# Patient Record
Sex: Female | Born: 1965 | Race: White | Hispanic: No | Marital: Married | Smoking: Never smoker
Health system: Southern US, Community
[De-identification: ages and names within clinical notes are randomized; demographics above are authoritative.]

## PROBLEM LIST (undated history)

## (undated) DIAGNOSIS — I519 Heart disease, unspecified: Secondary | ICD-10-CM

## (undated) DIAGNOSIS — R011 Cardiac murmur, unspecified: Secondary | ICD-10-CM

## (undated) DIAGNOSIS — Z9889 Other specified postprocedural states: Secondary | ICD-10-CM

## (undated) DIAGNOSIS — K5790 Diverticulosis of intestine, part unspecified, without perforation or abscess without bleeding: Secondary | ICD-10-CM

## (undated) DIAGNOSIS — Q249 Congenital malformation of heart, unspecified: Secondary | ICD-10-CM

## (undated) DIAGNOSIS — I509 Heart failure, unspecified: Secondary | ICD-10-CM

## (undated) DIAGNOSIS — I38 Endocarditis, valve unspecified: Secondary | ICD-10-CM

## (undated) DIAGNOSIS — E785 Hyperlipidemia, unspecified: Secondary | ICD-10-CM

## (undated) DIAGNOSIS — H10402 Unspecified chronic conjunctivitis, left eye: Secondary | ICD-10-CM

## (undated) DIAGNOSIS — Z9289 Personal history of other medical treatment: Secondary | ICD-10-CM

## (undated) DIAGNOSIS — Z950 Presence of cardiac pacemaker: Secondary | ICD-10-CM

## (undated) DIAGNOSIS — J039 Acute tonsillitis, unspecified: Secondary | ICD-10-CM

## (undated) HISTORY — PX: OTHER SURGICAL HISTORY: SHX169

## (undated) HISTORY — DX: Heart failure, unspecified: I50.9

## (undated) HISTORY — DX: Presence of cardiac pacemaker: Z95.0

## (undated) HISTORY — PX: CARDIAC SURGERY: SHX584

## (undated) HISTORY — DX: Personal history of other medical treatment: Z92.89

## (undated) HISTORY — DX: Other specified postprocedural states: Z98.890

## (undated) HISTORY — DX: Cardiac murmur, unspecified: R01.1

## (undated) HISTORY — DX: Unspecified chronic conjunctivitis, left eye: H10.402

## (undated) HISTORY — DX: Endocarditis, valve unspecified: I38

## (undated) HISTORY — DX: Hyperlipidemia, unspecified: E78.5

## (undated) HISTORY — DX: Heart disease, unspecified: I51.9

## (undated) HISTORY — DX: Acute tonsillitis, unspecified: J03.90

## (undated) HISTORY — DX: Diverticulosis of intestine, part unspecified, without perforation or abscess without bleeding: K57.90

## (undated) HISTORY — PX: TUBAL LIGATION: SHX77

## (undated) HISTORY — DX: Congenital malformation of heart, unspecified: Q24.9

## (undated) HISTORY — PX: ABDOMINAL HYSTERECTOMY: SHX81

---
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
## 1998-04-08 ENCOUNTER — Encounter (HOSPITAL_COMMUNITY): Admission: RE | Admit: 1998-04-08 | Discharge: 1998-07-07 | Payer: Self-pay

## 1998-04-16 ENCOUNTER — Other Ambulatory Visit: Admission: RE | Admit: 1998-04-16 | Discharge: 1998-04-16 | Payer: Self-pay | Admitting: Obstetrics and Gynecology

## 1998-07-09 ENCOUNTER — Encounter (HOSPITAL_COMMUNITY): Admission: RE | Admit: 1998-07-09 | Discharge: 1998-10-01 | Payer: Self-pay

## 1998-10-01 ENCOUNTER — Encounter (HOSPITAL_COMMUNITY): Admission: RE | Admit: 1998-10-01 | Discharge: 1998-12-30 | Payer: Self-pay

## 1999-04-17 ENCOUNTER — Other Ambulatory Visit: Admission: RE | Admit: 1999-04-17 | Discharge: 1999-04-17 | Payer: Self-pay | Admitting: Obstetrics and Gynecology

## 1999-05-26 ENCOUNTER — Encounter: Payer: Self-pay | Admitting: Obstetrics and Gynecology

## 1999-06-11 ENCOUNTER — Inpatient Hospital Stay (HOSPITAL_COMMUNITY): Admission: RE | Admit: 1999-06-11 | Discharge: 1999-06-14 | Payer: Self-pay | Admitting: Obstetrics and Gynecology

## 1999-06-11 ENCOUNTER — Encounter (INDEPENDENT_AMBULATORY_CARE_PROVIDER_SITE_OTHER): Payer: Self-pay | Admitting: Specialist

## 2000-06-07 ENCOUNTER — Other Ambulatory Visit: Admission: RE | Admit: 2000-06-07 | Discharge: 2000-06-07 | Payer: Self-pay | Admitting: Obstetrics and Gynecology

## 2001-07-27 ENCOUNTER — Other Ambulatory Visit: Admission: RE | Admit: 2001-07-27 | Discharge: 2001-07-27 | Payer: Self-pay | Admitting: Obstetrics and Gynecology

## 2002-08-16 ENCOUNTER — Other Ambulatory Visit: Admission: RE | Admit: 2002-08-16 | Discharge: 2002-08-16 | Payer: Self-pay | Admitting: Obstetrics and Gynecology

## 2003-09-09 ENCOUNTER — Other Ambulatory Visit: Admission: RE | Admit: 2003-09-09 | Discharge: 2003-09-09 | Payer: Self-pay | Admitting: Obstetrics and Gynecology

## 2004-10-06 ENCOUNTER — Other Ambulatory Visit: Admission: RE | Admit: 2004-10-06 | Discharge: 2004-10-06 | Payer: Self-pay | Admitting: Obstetrics and Gynecology

## 2005-11-09 ENCOUNTER — Other Ambulatory Visit: Admission: RE | Admit: 2005-11-09 | Discharge: 2005-11-09 | Payer: Self-pay | Admitting: Obstetrics and Gynecology

## 2005-11-24 ENCOUNTER — Encounter: Admission: RE | Admit: 2005-11-24 | Discharge: 2006-02-22 | Payer: Self-pay | Admitting: Obstetrics and Gynecology

## 2009-12-10 ENCOUNTER — Encounter (INDEPENDENT_AMBULATORY_CARE_PROVIDER_SITE_OTHER): Payer: Self-pay | Admitting: *Deleted

## 2009-12-17 ENCOUNTER — Ambulatory Visit: Payer: Self-pay | Admitting: Gastroenterology

## 2009-12-17 DIAGNOSIS — K5900 Constipation, unspecified: Secondary | ICD-10-CM | POA: Insufficient documentation

## 2009-12-17 DIAGNOSIS — R195 Other fecal abnormalities: Secondary | ICD-10-CM

## 2010-01-21 ENCOUNTER — Ambulatory Visit: Payer: Self-pay | Admitting: Gastroenterology

## 2010-01-21 LAB — HM COLONOSCOPY

## 2010-10-04 HISTORY — PX: APPENDECTOMY: SHX54

## 2010-10-10 ENCOUNTER — Inpatient Hospital Stay (HOSPITAL_COMMUNITY): Admission: RE | Admit: 2010-10-10 | Discharge: 2010-10-14 | Payer: Self-pay

## 2010-10-10 ENCOUNTER — Emergency Department (HOSPITAL_COMMUNITY)
Admission: EM | Admit: 2010-10-10 | Discharge: 2010-10-10 | Payer: Self-pay | Source: Home / Self Care | Admitting: Emergency Medicine

## 2010-10-11 ENCOUNTER — Encounter (INDEPENDENT_AMBULATORY_CARE_PROVIDER_SITE_OTHER): Payer: Self-pay

## 2010-10-19 LAB — DIFFERENTIAL
Basophils Absolute: 0 10*3/uL (ref 0.0–0.1)
Basophils Relative: 0 % (ref 0–1)
Eosinophils Absolute: 0 10*3/uL (ref 0.0–0.7)
Eosinophils Relative: 0 % (ref 0–5)
Lymphocytes Relative: 4 % — ABNORMAL LOW (ref 12–46)
Lymphs Abs: 0.7 10*3/uL (ref 0.7–4.0)
Monocytes Absolute: 1.2 10*3/uL — ABNORMAL HIGH (ref 0.1–1.0)
Monocytes Relative: 7 % (ref 3–12)
Neutro Abs: 15.4 10*3/uL — ABNORMAL HIGH (ref 1.7–7.7)
Neutrophils Relative %: 89 % — ABNORMAL HIGH (ref 43–77)

## 2010-10-19 LAB — BASIC METABOLIC PANEL
BUN: 9 mg/dL (ref 6–23)
BUN: 7 mg/dL (ref 6–23)
BUN: 4 mg/dL — ABNORMAL LOW (ref 6–23)
CO2: 25 mEq/L (ref 19–32)
CO2: 27 mEq/L (ref 19–32)
CO2: 28 mEq/L (ref 19–32)
Calcium: 9.3 mg/dL (ref 8.4–10.5)
Calcium: 8.3 mg/dL — ABNORMAL LOW (ref 8.4–10.5)
Calcium: 7.8 mg/dL — ABNORMAL LOW (ref 8.4–10.5)
Chloride: 100 mEq/L (ref 96–112)
Chloride: 103 mEq/L (ref 96–112)
Chloride: 102 mEq/L (ref 96–112)
Creatinine, Ser: 0.73 mg/dL (ref 0.4–1.2)
Creatinine, Ser: 0.55 mg/dL (ref 0.4–1.2)
Creatinine, Ser: 0.55 mg/dL (ref 0.4–1.2)
GFR calc Af Amer: >60 mL/min (ref >60)
GFR calc Af Amer: >60 mL/min (ref >60)
GFR calc Af Amer: >60 mL/min (ref >60)
GFR calc non Af Amer: >60 mL/min (ref >60)
GFR calc non Af Amer: >60 mL/min (ref >60)
GFR calc non Af Amer: >60 mL/min (ref >60)
Glucose, Bld: 124 mg/dL — ABNORMAL HIGH (ref 70–99)
Glucose, Bld: 145 mg/dL — ABNORMAL HIGH (ref 70–99)
Glucose, Bld: 116 mg/dL — ABNORMAL HIGH (ref 70–99)
Potassium: 3.9 mEq/L (ref 3.5–5.1)
Potassium: 3.6 mEq/L (ref 3.5–5.1)
Potassium: 2.8 mEq/L — ABNORMAL LOW (ref 3.5–5.1)
Sodium: 135 mEq/L (ref 135–145)
Sodium: 139 mEq/L (ref 135–145)
Sodium: 137 mEq/L (ref 135–145)

## 2010-10-19 LAB — URINALYSIS, ROUTINE W REFLEX MICROSCOPIC
Bilirubin Urine: NEGATIVE
Ketones, ur: NEGATIVE mg/dL
Leukocytes, UA: NEGATIVE
Nitrite: NEGATIVE
Protein, ur: 30 mg/dL — AB
Specific Gravity, Urine: 1.015 (ref 1.005–1.030)
Urine Glucose, Fasting: NEGATIVE mg/dL
Urobilinogen, UA: 0.2 mg/dL (ref 0.0–1.0)
pH: 7 (ref 5.0–8.0)

## 2010-10-19 LAB — PREPARE FRESH FROZEN PLASMA
Unit division: 0
Unit division: 0
Unit division: 0
Unit division: 0

## 2010-10-19 LAB — URINE MICROSCOPIC-ADD ON

## 2010-10-19 LAB — CBC
HCT: 40 % (ref 36.0–46.0)
HCT: 36.8 % (ref 36.0–46.0)
HCT: 33.1 % — ABNORMAL LOW (ref 36.0–46.0)
HCT: 33.1 % — ABNORMAL LOW (ref 36.0–46.0)
Hemoglobin: 14.6 g/dL (ref 12.0–15.0)
Hemoglobin: 12.5 g/dL (ref 12.0–15.0)
Hemoglobin: 11.3 g/dL — ABNORMAL LOW (ref 12.0–15.0)
Hemoglobin: 11.2 g/dL — ABNORMAL LOW (ref 12.0–15.0)
MCH: 32.7 pg (ref 26.0–34.0)
MCH: 32 pg (ref 26.0–34.0)
MCH: 31.6 pg (ref 26.0–34.0)
MCH: 31.4 pg (ref 26.0–34.0)
MCHC: 36.5 g/dL — ABNORMAL HIGH (ref 30.0–36.0)
MCHC: 34 g/dL (ref 30.0–36.0)
MCHC: 34.1 g/dL (ref 30.0–36.0)
MCHC: 33.8 g/dL (ref 30.0–36.0)
MCV: 89.5 fL (ref 78.0–100.0)
MCV: 94.1 fL (ref 78.0–100.0)
MCV: 92.5 fL (ref 78.0–100.0)
MCV: 92.7 fL (ref 78.0–100.0)
Platelets: 132 10*3/uL — ABNORMAL LOW (ref 150–400)
Platelets: 94 10*3/uL — ABNORMAL LOW (ref 150–400)
Platelets: 114 10*3/uL — ABNORMAL LOW (ref 150–400)
Platelets: 111 10*3/uL — ABNORMAL LOW (ref 150–400)
RBC: 4.47 MIL/uL (ref 3.87–5.11)
RBC: 3.91 MIL/uL (ref 3.87–5.11)
RBC: 3.58 MIL/uL — ABNORMAL LOW (ref 3.87–5.11)
RBC: 3.57 MIL/uL — ABNORMAL LOW (ref 3.87–5.11)
RDW: 13 % (ref 11.5–15.5)
RDW: 13.3 % (ref 11.5–15.5)
RDW: 12.9 % (ref 11.5–15.5)
RDW: 12.8 % (ref 11.5–15.5)
WBC: 17.4 10*3/uL — ABNORMAL HIGH (ref 4.0–10.5)
WBC: 12.7 10*3/uL — ABNORMAL HIGH (ref 4.0–10.5)
WBC: 8.8 10*3/uL (ref 4.0–10.5)
WBC: 6.9 10*3/uL (ref 4.0–10.5)

## 2010-10-19 LAB — COMPREHENSIVE METABOLIC PANEL
ALT: 22 U/L (ref 0–35)
AST: 24 U/L (ref 0–37)
Albumin: 4.1 g/dL (ref 3.5–5.2)
Alkaline Phosphatase: 41 U/L (ref 39–117)
BUN: 8 mg/dL (ref 6–23)
CO2: 24 mEq/L (ref 19–32)
Calcium: 9.2 mg/dL (ref 8.4–10.5)
Chloride: 100 mEq/L (ref 96–112)
Creatinine, Ser: 0.71 mg/dL (ref 0.4–1.2)
GFR calc Af Amer: >60 mL/min (ref >60)
GFR calc non Af Amer: >60 mL/min (ref >60)
Glucose, Bld: 124 mg/dL — ABNORMAL HIGH (ref 70–99)
Potassium: 3.8 mEq/L (ref 3.5–5.1)
Sodium: 135 mEq/L (ref 135–145)
Total Bilirubin: 1 mg/dL (ref 0.3–1.2)
Total Protein: 7.5 g/dL (ref 6.0–8.3)

## 2010-10-19 LAB — ABO/RH
ABO/RH(D): O POS
ABO/RH(D): O POS

## 2010-10-19 LAB — PROTIME-INR
INR: 6.87 (ref 0.00–1.49)
INR: 1.3 (ref 0.00–1.49)
INR: 1.31 (ref 0.00–1.49)
INR: 1.34 (ref 0.00–1.49)
Prothrombin Time: 59 seconds — ABNORMAL HIGH (ref 11.6–15.2)
Prothrombin Time: 16.4 seconds — ABNORMAL HIGH (ref 11.6–15.2)
Prothrombin Time: 16.5 seconds — ABNORMAL HIGH (ref 11.6–15.2)
Prothrombin Time: 16.8 seconds — ABNORMAL HIGH (ref 11.6–15.2)

## 2010-10-19 LAB — TYPE AND SCREEN
ABO/RH(D): O POS
Antibody Screen: NEGATIVE

## 2010-10-19 LAB — MRSA PCR SCREENING: MRSA by PCR: NEGATIVE

## 2010-10-19 LAB — LIPASE, BLOOD: Lipase: 20 U/L (ref 11–59)

## 2010-10-19 LAB — APTT
aPTT: 127 seconds — ABNORMAL HIGH (ref 24–37)
aPTT: 37 seconds (ref 24–37)

## 2010-10-19 LAB — MAGNESIUM: Magnesium: 1.8 mg/dL (ref 1.5–2.5)

## 2010-11-03 NOTE — Procedures (Signed)
Summary: Hold letter/[facility]  Hold letter/[facility]   Imported By: Lester Pomona 12/24/2009 09:52:39  _____________________________________________________________________  External Attachment:    Type:   Image     Comment:   External Document

## 2010-11-03 NOTE — Letter (Signed)
Summary: New Patient letter  [facility]  [address] [place], [place] [address]   Phone: [phone]  Fax: [phone]       12/10/2009 MRN: [id number]  Brandi Fischer [address] [place], [place]  [address]  Dear Ms. Andrey Campanile,  Welcome to the Gastroenterology Division at [facility].    You are scheduled to see Dr. Claudette Head on December 17, 2009 at 2:30pm on the 3rd floor at [facility], [address]  We ask that you try to arrive at our office 15 minutes prior to your appointment time to allow for check-in.  We would like you to complete the enclosed self-administered evaluation form prior to your visit and bring it with you on the day of your appointment.  We will review it with you.  Also, please bring a complete list of all your medications or, if you prefer, bring the medication bottles and we will list them.  Please bring your insurance card so that we may make a copy of it.  If your insurance requires a referral to see a specialist, please bring your referral form from your primary care physician.  Co-payments are due at the time of your visit and may be paid by cash, check or credit card.     Your office visit will consist of a consult with your physician (includes a physical exam), any laboratory testing he/she may order, scheduling of any necessary diagnostic testing (e.g. x-ray, ultrasound, CT-scan), and scheduling of a procedure (e.g. Endoscopy, Colonoscopy) if required.  Please allow enough time on your schedule to allow for any/all of these possibilities.    If you cannot keep your appointment, please call [phone] to cancel or reschedule prior to your appointment date.  This allows [place] the opportunity to schedule an appointment for another patient in need of care.  If you do not cancel or reschedule by 5 p.m. the business day prior to your appointment date, you will be charged a $50.00 late cancellation/no-show fee.    Thank you for  choosing [facility] for your medical needs.  We appreciate the opportunity to care for you.  Please visit [place] at our website  to learn more about our practice.                     Sincerely,                                                             The Gastroenterology Division

## 2010-11-03 NOTE — Assessment & Plan Note (Signed)
Summary: +HEM STOOL.Marland KitchenMarland KitchenEM   History of Present Illness Visit Type: consult Primary GI MD: Elie Goody MD [facility] Primary Provider: Kari Baars, MD Requesting Provider: Ainsley Spinner Chief Complaint: positive hemoccult test at GYN office History of Present Illness:   This is a 45 year old female with Hemoccult-positive stool and mild chronic constipation. 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 She has mild chronic constipation and occasionally notes rectal discomfort with hard bowel movements.  She has a history of heart block with a permanent pacemaker.   GI Review of Systems      Denies abdominal pain, acid reflux, belching, bloating, chest pain, dysphagia with liquids, dysphagia with solids, heartburn, loss of appetite, nausea, vomiting, vomiting blood, weight loss, and  weight gain.      Reports constipation and  heme positive stool.     Denies anal fissure, black tarry stools, change in bowel habit, diarrhea, diverticulosis, fecal incontinence, hemorrhoids, irritable bowel syndrome, jaundice, light color stool, liver problems, rectal bleeding, and  rectal pain.   Current Medications (verified): 1)  Lanoxin 0.125 Mg Tabs (Digoxin) .... One Tablet By Mouth Once Daily 2)  Atenolol 25 Mg Tabs (Atenolol) .... One Tablet By Mouth Once Daily 3)  Coumadin 5 Mg Tabs (Warfarin Sodium) .... One Tablet By Mouth At Bedtime For 2 Days, On 3rd Day Take 2 Tablets 4)  Centrum Ultra Womens  Tabs (Multiple Vitamins-Minerals) .... Take 1 Tablet By Mouth Once A Day 5)  Calcium 600 1500 Mg Tabs (Calcium Carbonate) .... Take 1 Tablet By Mouth Two Times A Day  Allergies (verified): 1)  ! 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  Alcohol Use - no Illicit Drug Use - no  Review of Systems       The pertinent positives and negatives are noted as above and in the HPI. All other ROS were reviewed and were negative.   Vital Signs:  Patient profile:   45 year old female Height:      62 inches Weight:      140 pounds BMI:     25.70 Pulse rate:   60 / minute Pulse rhythm:   regular BP sitting:   100 / 60  (left arm) Cuff size:   regular  Vitals Entered By: Francee Piccolo CMA Duncan Dull) (December 17, 2009 2:24 PM)  Physical Exam  General:  Well developed, well nourished, no acute distress. Head:  Normocephalic and atraumatic. Eyes:  PERRLA, no icterus. Ears:  Normal auditory acuity. Mouth:  No deformity or lesions, dentition normal. Neck:  Supple; no masses or thyromegaly. Chest Wall:  Symmetrical;  no deformities or tenderness. Heart:  Regular rate and rhythm; no murmurs, rubs,  or bruits. Abdomen:  Soft, nontender and nondistended. No masses, hepatosplenomegaly or hernias noted. Normal bowel sounds. Rectal:  deferred until time of colonoscopy.   Msk:  Symmetrical with no gross deformities. Normal posture. Pulses:  Normal pulses noted. Extremities:  No clubbing, cyanosis, edema or deformities noted. Neurologic:  Alert and  oriented x4;  grossly normal neurologically. Cervical Nodes:  No significant cervical adenopathy. Inguinal Nodes:  No significant inguinal adenopathy. Psych:  Alert and cooperative. Normal mood and affect.  Impression & Recommendations:  Problem # 1:  BLOOD IN STOOL, OCCULT (ICD-792.1) Rule out colorectal neoplasms, hemorrhoids and other colorectal sources of blood loss. The risks, benefits and alternatives to colonoscopy with possible biopsy and possible  polypectomy were discussed with the patient and they consent to proceed. The procedure will be scheduled electively.  Orders: Colonoscopy (Colon)  Problem # 2:  COUMADIN THERAPY (ICD-V58.61) The risks, benefits, and alternatives to 5 day hold Coumadin anticoagulation were discussed with the patient and she consents to proceed. Obtain clearance from Dr. Juanetta Gosling  Problem # 3:  CONSTIPATION (ICD-564.00) Increase daily fiber and fluid intake.  Patient Instructions: 1)  Colonoscopy brochure given.  2)  Pick up your prep from your pharmacy.  3)  Copy sent to : Kari Baars, MD 4)                         Richardean Chimera, MD 5)  The medication list was reviewed and reconciled.  All changed / newly prescribed medications were explained.  A complete medication list was provided to the patient / caregiver.  Prescriptions: MOVIPREP 100 GM  SOLR (PEG-KCL-NACL-NASULF-NA ASC-C) As per prep instructions.  #1 x 0   Entered by:   Christie Nottingham CMA (AAMA)   Authorized by:   Meryl Dare MD [facility]   Signed by:   Christie Nottingham CMA (AAMA) on 12/17/2009   Method used:   Electronically to        [facility] (retail)       [address]       [place], [place]  [address]       Ph: [phone] or [phone]       Fax: [phone]   RxID:   [id number]

## 2010-11-03 NOTE — Procedures (Signed)
Summary: Colonoscopy  Patient: Brandi Fischer Note: All result statuses are Final unless otherwise noted.  Tests: (1) Colonoscopy (COL)   COL Colonoscopy           DONE      [facility]     [address]     [place], [place]  [address]           COLONOSCOPY PROCEDURE REPORT           PATIENT:  Brandi Fischer, Brandi Fischer  MR#:  [id number]     BIRTHDATE:  16-Jun-1966, 44 yrs. old  GENDER:  female     ENDOSCOPIST:  Judie Petit T. Russella Dar, MD, [facility]     Referred by:  Richardean Chimera, M.D.     PROCEDURE DATE:  01/21/2010     PROCEDURE:  Colonoscopy 21308     ASA CLASS:  Class III     INDICATIONS:  1) FOBT positive stool     MEDICATIONS:   Fentanyl 75 mcg IV, Versed 7 mg IV     DESCRIPTION OF PROCEDURE:   After the risks benefits and     alternatives of the procedure were thoroughly explained, informed     consent was obtained.  Digital rectal exam was performed and     revealed no abnormalities.   The LB [id number]-[id number] endoscope     was introduced through the anus and advanced to the cecum, which     was identified by both the appendix and ileocecal valve, without     limitations.  The quality of the prep was excellent, using     MoviPrep.  The instrument was then slowly withdrawn as the colon     was fully examined.     <<PROCEDUREIMAGES>>           FINDINGS:  Mild diverticulosis was found in the sigmoid colon. A     normal appearing cecum, ileocecal valve, and appendiceal orifice     were identified. The ascending, hepatic flexure, transverse,     splenic flexure, descending, and rectum appeared unremarkable.     Retroflexed views in the rectum revealed no abnormalities. The     time to cecum =  2  minutes. The scope was then withdrawn (time =     9.25  min) from the patient and the procedure completed.           COMPLICATIONS:  None           ENDOSCOPIC IMPRESSION:     1) Mild diverticulosis in the sigmoid colon           RECOMMENDATIONS:     1) High fiber diet with liberal fluid  intake.     2) Continue current colorectal screening recommendations for     "routine risk" patients with a repeat colonoscopy in 10 years.     3) Resume Lovenox and Coumadin as directed today           Perrin Gens T. Russella Dar, MD, [facility]           CC: Shaune Pollack, MD           n.     Rosalie DoctorVenita Lick. Kendelle Schweers at 01/21/2010 04:17 PM           [employer] Dials, [id number]  Note: An exclamation mark (!) indicates a result that was not dispersed into the flowsheet. Document Creation Date: 01/21/2010 4:18 PM _______________________________________________________________________  (1) Order result status: Final Collection or observation date-time: 01/21/2010 16:06 Requested date-time:  Receipt date-time:  Reported date-time:  Referring Physician:   Ordering Physician: Claudette Head [phone]) Specimen Source:  Source: Launa Grill Order Number: [id number] Lab site:   Appended Document: Colonoscopy    Clinical Lists Changes  Observations: Added new observation of COLONNXTDUE: 01/2020 (01/21/2010 16:20)

## 2010-11-03 NOTE — Letter (Signed)
Summary: Anticoagulation Modification Letter  [facility]  [address] [place], [place] [address]   Phone: [phone]  Fax: [phone]    December 17, 2009  Re:    Brandi Fischer    Dear Dr. Juanetta Gosling,  We have scheduled the above patient for an endoscopic procedure. Our records show that  he/she is on anticoagulation therapy. Please advise as to how long the patient may come off their therapy of coumadin prior to the scheduled procedure(s) on 01/21/10.   Please fax back/or route the completed form to [place] at [phone].  Thank you for your help with this matter.  Sincerely,  Christie Nottingham CMA Duncan Dull)   Physician Recommendation:  Hold Plavix 7 days prior ________________  Hold Coumadin 5 days prior ____________  Other ______________________________     Appended Document: Anticoagulation Modification Letter Spoke with Joy at Dr. Juanetta Gosling office and she states the pt has to be bridged with Lovenox. She states she will set the pt up for this and notify her of what to do before the procedure. Joy states she will contact me if she needs anything else or has questions. She agreed to take care of everything and notifiy the pt. Letter to be scanned into EMR showing Dr. Juanetta Gosling response to pt coming off coumadin.

## 2010-11-03 NOTE — Letter (Signed)
Summary: [facility] Instructions  [facility]  [address] [place], [place] [address]   Phone: [phone]  Fax: [phone]       Brandi Fischer    1966-07-28    MRN: [id number]        Procedure Day Dorna Bloom: Wednesday April 20th, 2011     Arrival Time: 2:00pm     Procedure Time: 3:00pm     Location of Procedure:                    _ x_  [facility] (4th Floor)                        PREPARATION FOR COLONOSCOPY WITH MOVIPREP   Starting 5 days prior to your procedure  01/16/10 do not eat nuts, seeds, popcorn, corn, beans, peas,  salads, or any raw vegetables.  Do not take any fiber supplements (e.g. Metamucil, Citrucel, and Benefiber).  THE DAY BEFORE YOUR PROCEDURE         DATE: 01/20/10   DAY:  Tuesday  1.  Drink clear liquids the entire day-NO SOLID FOOD  2.  Do not drink anything colored red or purple.  Avoid juices with pulp.  No orange juice.  3.  Drink at least 64 oz. (8 glasses) of fluid/clear liquids during the day to prevent dehydration and help the prep work efficiently.  CLEAR LIQUIDS INCLUDE: Water Jello Ice Popsicles Tea (sugar ok, no milk/cream) Powdered fruit flavored drinks Coffee (sugar ok, no milk/cream) Gatorade Juice: apple, white grape, white cranberry  Lemonade Clear bullion, consomm, broth Carbonated beverages (any kind) Strained chicken noodle soup Hard Candy                             4.  In the morning, mix first dose of MoviPrep solution:    Empty 1 Pouch A and 1 Pouch B into the disposable container    Add lukewarm drinking water to the top line of the container. Mix to dissolve    Refrigerate (mixed solution should be used within 24 hrs)  5.  Begin drinking the prep at 5:00 p.m. The MoviPrep container is divided by 4 marks.   Every 15 minutes drink the solution down to the next mark (approximately 8 oz) until the full liter is complete.   6.  Follow completed prep with 16 oz of clear liquid of your  choice (Nothing red or purple).  Continue to drink clear liquids until bedtime.  7.  Before going to bed, mix second dose of MoviPrep solution:    Empty 1 Pouch A and 1 Pouch B into the disposable container    Add lukewarm drinking water to the top line of the container. Mix to dissolve    Refrigerate  THE DAY OF YOUR PROCEDURE      DATE:  01/21/10  DAY:  Wednesday  Beginning at  10:00 a.m. (5 hours before procedure):         1. Every 15 minutes, drink the solution down to the next mark (approx 8 oz) until the full liter is complete.  2. Follow completed prep with 16 oz. of clear liquid of your choice.    3. You may drink clear liquids until  1:00pm  (2 HOURS BEFORE PROCEDURE).   MEDICATION INSTRUCTIONS  Unless otherwise instructed, you should take regular prescription medications with a small sip of water  as early as possible the morning of your procedure.     Stop taking Coumadin on  _ _  (5 days before procedure) You will be contaced by our office prior to your procedure for directions on holding your Coumadin/Warfarin.  If you do not hear from our office 1 week prior to your scheduled procedure, please call [phone] to discuss.          OTHER INSTRUCTIONS  You will need a responsible adult at least 45 years of age to accompany you and drive you home.   This person must remain in the waiting room during your procedure.  Wear loose fitting clothing that is easily removed.  Leave jewelry and other valuables at home.  However, you may wish to bring a book to read or  an iPod/MP3 player to listen to music as you wait for your procedure to start.  Remove all body piercing jewelry and leave at home.  Total time from sign-in until discharge is approximately 2-3 hours.  You should go home directly after your procedure and rest.  You can resume normal activities the  day after your procedure.  The day of your procedure you should not:   Drive   Make legal  decisions   Operate machinery   Drink alcohol   Return to work  You will receive specific instructions about eating, activities and medications before you leave.    The above instructions have been reviewed and explained to me by   Marchelle Folks.     I fully understand and can verbalize these instructions _____________________________ Date _________

## 2010-11-18 NOTE — Consult Note (Signed)
NAMEROAN, [name]               ACCOUNT NO.:  [id number]  MEDICAL RECORD NO.:  [id number]          PATIENT TYPE:  INP  LOCATION:  3305                         FACILITY:  [facility]  PHYSICIAN:  Karn Cassis, MD  DATE OF BIRTH:  06/28/1966  DATE OF CONSULTATION:  10/10/2010 DATE OF DISCHARGE:                                CONSULTATION   CARDIOLOGIST:  Dr. Sharol Harness and Dr. Basilia Jumbo at [facility].  REASON FOR CONSULTATION:  We are seeing [name] in consultation with Dr. Janee Morn for preoperative evaluation prior to an emergent appendectomy.  HISTORY OF PRESENT ILLNESS:  Brandi Fischer is a 45 year old lady with history of tetralogy of Fallot, status post surgical repair in 1969 who is admitted to the hospital with appendicitis.  The patient reports that her cardiac history is significant for tetralogy of Fallot repair.  After her surgical repair, she has not had any problems and is able to live a very functional and active lifestyle. Her MEP are greater than 4.  She had actually no symptoms of angina or heart failure with physical exertion.  She also underwent a pacemaker placement in 1990s.  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  Since then, she has been on anticoagulation with warfarin.  She was in her usual state of health up until Friday when she developed right lower quadrant abdominal pain accompanied with symptoms of nausea. Her workup revealed elevated white count of 17.4 and she was diagnosed with having acute appendicitis.  She is currently waiting surgery pending correction of her INR which was 6.8.  PAST MEDICAL HISTORY: 1. Tetralogy of Fallot, status post repair. 2. Pacemaker ([employer]. [employer]). 3. 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  ALLERGIES:  No known drug allergies.  MEDICATIONS:  She is on: 1. Atenolol 25 mg once a day. 2.  Warfarin.  She alternates between 5 and 10 mg every other day. 3. Digoxin 0.125 mg once a day.  FAMILY HISTORY:  No significant family history of congenital heart disease.  She has no family history of early coronary artery disease.  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  No history of alcohol use, illicit drug use, or smoking.  REVIEW OF SYSTEMS:  A 12-point review of systems is negative except for symptoms outlined in the HPI above.  PHYSICAL EXAMINATION:  VITAL SIGNS:  Her temperature is 100.2, pulse of 100, respiratory rate 16, and blood pressure 108/51.  She is saturating 94% on room air. GENERAL:  A well-appearing lady, in no apparent distress. CARDIOVASCULAR:  Regular rate and rhythm.  She has 5/6 systolic ejection murmur that is best heard at the left upper sternal border. LUNGS:  Clear to auscultation bilaterally. ABDOMEN.  She has diffuse abdominal tenderness with diminished bowel sounds. EXTREMITIES:  No signs of clubbing, cyanosis, or edema. PSYCH:  Alert and oriented to time, place, and person. SKIN:  No rashes or lesions.  LABORATORY DATA:  As mentioned above, her white count is 17.4, hematocrit is 40, and  platelet count of 132.  Her INR is 6.87.  Sodium of 135, potassium 3.8, chloride 100, bicarb 24, and creatinine 0.7.  Her AST and ALT are within normal limits.  EKG shows an incomplete right bundle, occasional atrial-paced rhythm, rare PVC, and ST changes consistent with incomplete right bundle.  ASSESSMENT/PLAN:  In summary, this is a 45 year old lady with history of repaired at tetralogy of Fallot who is admitted for appendicitis.  She is scheduled to go an emergent appendectomy.  We are seeing her in consultation for preoperative evaluation.  1. Proceed to surgery.  She will need antibiotic prophylaxis since she     has congenital heart status post repair.  I recommend ampicillin 2     g IV or cefazolin 1 g IV or ceftriaxone  1 g IV.  Her EKG did not     show any signs of ischemia.  It is common for the patients with     tetralogy repair to have a right bundle.  If the surgery is     performed in the morning, the pacemaker could be programed to [facility]     since she is being only intermittently paced at the atrium without     ventricular pacing.  If the surgery is performed in the middle of     night, a magnet could be used instead.  In addition to above, I     recommend continuation of her home dose of beta-blocker.  The above plan was discussed with Dr. Jens Som.     Karn Cassis, MD     PV/MEDQ  D:  10/10/2010  T:  10/11/2010  Job:  [id number]  Electronically Signed by Karn Cassis MD on 11/18/2010 01:22:17 PM

## 2011-07-01 ENCOUNTER — Encounter: Payer: Self-pay | Admitting: *Deleted

## 2011-07-01 ENCOUNTER — Encounter: Payer: BC Managed Care – PPO | Attending: Obstetrics and Gynecology | Admitting: *Deleted

## 2011-07-01 DIAGNOSIS — E789 Disorder of lipoprotein metabolism, unspecified: Secondary | ICD-10-CM | POA: Insufficient documentation

## 2011-07-01 DIAGNOSIS — Z713 Dietary counseling and surveillance: Secondary | ICD-10-CM | POA: Insufficient documentation

## 2011-07-01 NOTE — Patient Instructions (Addendum)
Goals:   Follow meal pattern on yellow card; Aim for 1200 calories/day if not exercising.  Increase fiber to 25-30 g and fluid to >64 oz daily to help decrease cholesterol and regulate blood sugar.  Limit meals away from home to decrease sodium and saturated fat intake.  Keep intake of dietary cholesterol to <200-300 mg daily.  Keep a food journal for a minimum of 2-3 days/week for the next 30 days and bring to next appointment.  Aim for 30 min of moderate physical exercise 3 times/week. Try walking around the neighborhood; break into increments if needed.

## 2011-07-01 NOTE — Progress Notes (Signed)
Medical Nutrition Therapy:  Appt start time: 1630 end time:  1730.  Assessment:  Primary concerns today: Abnormal Lipid Metabolism (272.9).  Pt here with elevated lipid panel.  Reports open heart surgery as an infant and a pacemaker since 6th grade. Also states her MD thinks she is on the brink of metabolic syndrome as pt has elevated FBGs as well. Food recall reveals elevated Na+ intake through meals out and some excessive simple sugar intake.  Pt loves bread and starches, not much of sweets person.  No ETOH noted.     MEDICATIONS: Atenolol, Coumadin, MVI, Vit D3 (50K unit/wk - on 4th of 6 wk treatment), Lanoxin   DIETARY INTAKE:  Usual eating pattern includes 3 meals and 2 snacks per day.  Pt limits Vit K intake d/t coumadin.  24-hr recall:  B ( AM): Instant oatmeal w/ maple, brown sugar; 1 cup coffee (low caffeine), 6 oz OJ (takes meds)  Snk ( AM): fiber one bar  L ( PM): lean cuisine (cheese ravioli), lg apple; water OR pecan crusted trout w/ zuccini/squash and grits; 1/2&1/2 tea (1-2x/wk out) Snk ( PM): none D ( PM): Grilled pork chops, brown rice (1/2 cup), baked beans; sweet tea (12 oz) OR [place] quesadilla w/ chicken & rice; 1/2&1/2 tea Snk ( PM): mini bag popcorn; diet pepsi Beverages: sweet tea, water, diet soda (caffeine free)  Usual physical activity: None  Estimated energy needs: 1200 calories 150 g carbohydrates 75 g protein 30-35 g fat (<10g as saturated fat)  Nutritional Diagnosis:  Taylor-2.2 Altered nutrition-related laboratory related to excessive intake of simple sugars and fat through meals from outside the home as evidenced by elevated TG/cholesterol and MD referral for abnormal lipid metabolism..    Intervention/Goals:    Follow meal pattern on yellow card; Aim for 1200 calories/day if not exercising.  Increase fiber to 25-30 g daily to help decrease cholesterol and regulate blood sugar.  Limit meals away from home to decrease sodium and saturated fat  intake.  Keep intake of dietary cholesterol to <200-300 mg daily.  Keep a food journal for a minimum of 2-3 days/week for the next 30 days and bring to next appointment.  Aim for 30 min of moderate physical exercise 3 times/week. Try walking around the neighborhood; break into increments if needed.  Handouts given during visit include:  Cholesterol handouts  HTN/Chol class ppt  One Day Food Record  Monitoring/Evaluation:  Dietary intake, exercise, lipid panel (as available), and body weight in 4 week(s).

## 2011-07-14 LAB — PROTIME-INR: INR: 2.5 — AB (ref 0.9–1.1)

## 2011-08-18 LAB — PROTIME-INR: INR: 3.3 — AB (ref 0.9–1.1)

## 2011-10-13 LAB — PROTIME-INR: INR: 2.8 — AB (ref 0.9–1.1)

## 2011-11-17 LAB — PROTIME-INR: INR: 3.1 — AB (ref 0.9–1.1)

## 2011-11-18 ENCOUNTER — Other Ambulatory Visit (HOSPITAL_COMMUNITY): Payer: Self-pay | Admitting: Pulmonary Disease

## 2011-11-18 DIAGNOSIS — R1901 Right upper quadrant abdominal swelling, mass and lump: Secondary | ICD-10-CM

## 2011-11-23 ENCOUNTER — Ambulatory Visit (HOSPITAL_COMMUNITY)
Admission: RE | Admit: 2011-11-23 | Discharge: 2011-11-23 | Disposition: A | Discharge status: Home / Self Care | Payer: BC Managed Care – PPO | Source: Ambulatory Visit | Attending: Pulmonary Disease | Admitting: Pulmonary Disease

## 2011-11-23 DIAGNOSIS — R1901 Right upper quadrant abdominal swelling, mass and lump: Secondary | ICD-10-CM | POA: Insufficient documentation

## 2011-11-23 MED ORDER — IOHEXOL 300 MG/ML  SOLN
100.0000 mL | Freq: Once | INTRAMUSCULAR | Status: AC | PRN
  Administered 2011-11-23: 100 mL via INTRAVENOUS

## 2012-01-17 DIAGNOSIS — Q203 Discordant ventriculoarterial connection: Secondary | ICD-10-CM | POA: Insufficient documentation

## 2012-01-19 LAB — PROTIME-INR: INR: 3.1 — AB (ref 0.9–1.1)

## 2012-03-22 LAB — PROTIME-INR: INR: 1.9 — AB (ref 0.9–1.1)

## 2012-04-25 LAB — PROTIME-INR: INR: 3.3 — AB (ref 0.9–1.1)

## 2012-05-30 LAB — PROTIME-INR: INR: 2.3 — AB (ref 0.9–1.1)

## 2012-07-26 LAB — PROTIME-INR: INR: 2.3 — AB (ref 0.9–1.1)

## 2012-11-06 LAB — PROTIME-INR: INR: 2.1 — AB (ref 0.9–1.1)

## 2012-12-28 LAB — PROTIME-INR: INR: 1.9 — AB (ref 0.9–1.1)

## 2013-01-15 DIAGNOSIS — Z950 Presence of cardiac pacemaker: Secondary | ICD-10-CM | POA: Insufficient documentation

## 2013-01-24 LAB — PROTIME-INR: INR: 3.1 — AB (ref 0.9–1.1)

## 2013-03-13 LAB — PROTIME-INR: INR: 1.8 — AB (ref 0.9–1.1)

## 2013-06-19 LAB — PROTIME-INR: INR: 2 — AB (ref 0.9–1.1)

## 2013-07-31 LAB — PROTIME-INR: INR: 3.2 — AB (ref 0.9–1.1)

## 2013-08-14 ENCOUNTER — Encounter (HOSPITAL_COMMUNITY): Payer: Self-pay | Admitting: Emergency Medicine

## 2013-08-14 ENCOUNTER — Emergency Department (HOSPITAL_COMMUNITY)
Admission: EM | Admit: 2013-08-14 | Discharge: 2013-08-14 | Disposition: A | Discharge status: Home / Self Care | Payer: BC Managed Care – PPO | Attending: Emergency Medicine | Admitting: Emergency Medicine

## 2013-08-14 DIAGNOSIS — R799 Abnormal finding of blood chemistry, unspecified: Secondary | ICD-10-CM | POA: Insufficient documentation

## 2013-08-14 DIAGNOSIS — Q249 Congenital malformation of heart, unspecified: Secondary | ICD-10-CM | POA: Insufficient documentation

## 2013-08-14 DIAGNOSIS — Z8639 Personal history of other endocrine, nutritional and metabolic disease: Secondary | ICD-10-CM | POA: Insufficient documentation

## 2013-08-14 DIAGNOSIS — Z862 Personal history of diseases of the blood and blood-forming organs and certain disorders involving the immune mechanism: Secondary | ICD-10-CM | POA: Insufficient documentation

## 2013-08-14 DIAGNOSIS — R791 Abnormal coagulation profile: Secondary | ICD-10-CM

## 2013-08-14 DIAGNOSIS — Z95810 Presence of automatic (implantable) cardiac defibrillator: Secondary | ICD-10-CM | POA: Insufficient documentation

## 2013-08-14 DIAGNOSIS — Z79899 Other long term (current) drug therapy: Secondary | ICD-10-CM | POA: Insufficient documentation

## 2013-08-14 DIAGNOSIS — Z7901 Long term (current) use of anticoagulants: Secondary | ICD-10-CM | POA: Insufficient documentation

## 2013-08-14 DIAGNOSIS — R040 Epistaxis: Secondary | ICD-10-CM

## 2013-08-14 LAB — CBC
HCT: 39.3 % (ref 36.0–46.0)
Hemoglobin: 13.5 g/dL (ref 12.0–15.0)
MCH: 31.9 pg (ref 26.0–34.0)
MCHC: 34.4 g/dL (ref 30.0–36.0)
MCV: 92.9 fL (ref 78.0–100.0)
Platelets: 129 10*3/uL — ABNORMAL LOW (ref 150–400)
RBC: 4.23 MIL/uL (ref 3.87–5.11)
RDW: 13.1 % (ref 11.5–15.5)
WBC: 5.7 10*3/uL (ref 4.0–10.5)

## 2013-08-14 LAB — PROTIME-INR
INR: 5.43 (ref 0.00–1.49)
Prothrombin Time: 47.3 seconds — ABNORMAL HIGH (ref 11.6–15.2)

## 2013-08-14 MED ORDER — OXYMETAZOLINE HCL 0.05 % NA SOLN
2.0000 | Freq: Once | NASAL | Status: AC
  Administered 2013-08-14: 2 via NASAL
  Filled 2013-08-14: qty 15

## 2013-08-14 NOTE — ED Notes (Signed)
Nose bleed has no stopped. Pt is comfortable and resting in room.

## 2013-08-14 NOTE — ED Notes (Signed)
Pt c/o nose bleeding from left nare that started this am around 6;00. Denies any pain.

## 2013-08-14 NOTE — ED Notes (Signed)
Pt presents to ED with nose bleed from left nare that began at 0600 this morning. Pt states bleeding has been constant. Pt denies headache. Pt is currently taking Coumadin.

## 2013-08-14 NOTE — ED Notes (Signed)
Gave gingerale  

## 2013-08-14 NOTE — ED Provider Notes (Signed)
Medical screening examination/treatment/procedure(s) were conducted as a shared visit with non-physician practitioner(s) and myself.  I personally evaluated the patient during the encounter.  EKG Interpretation   None      Medical screening examination/treatment/procedure(s) were conducted as a shared visit with non-physician practitioner(s) and myself.  I personally evaluated the patient during the encounter.  EKG Interpretation   None      Left epistaxis has improved.  INR elevated.  Hold Coumadin,.   F/U c primary care  Donnetta Hutching, MD 08/14/13 [phone]

## 2013-08-14 NOTE — ED Provider Notes (Signed)
CSN: [id number]     Arrival date & time 08/14/13  0732 History   First MD Initiated Contact with Patient 08/14/13 [id number]     Chief Complaint  Patient presents with  . Epistaxis   (Consider location/radiation/quality/duration/timing/severity/associated sxs/prior Treatment) HPI Comments: Brandi Fischer is a 47 y.o. Female presenting with epistaxis from her left nostril which started this morning around 6 am.  She denies inciting injury but has had a recent URI including nasal congestion for which she is using dayquill which has an antihistamine and decongestant product.  Additionally she was placed on cefdinir 2 days ago (for a 7 day course).  She is on coumadin for a congenital heart defect with her last INR about 2 weeks ago being therapeutic.  She denies weakness,  Dizziness or other complaint.     The history is provided by the patient.    Past Medical History  Diagnosis Date  . Congenital heart defect 1967  . Pacemaker     6th grade  . Hyperlipidemia    Past Surgical History  Procedure Laterality Date  . Appendectomy  10/2010  . Abdominal hysterectomy    . Cardiac surgery     Family History  Problem Relation Age of Onset  . Diabetes Maternal Grandfather   . Diabetes Paternal Grandmother    History  Substance Use Topics  . Smoking status: Never Smoker   . Smokeless tobacco: Not on file  . Alcohol Use: No   OB History   Grav Para Term Preterm Abortions TAB SAB Ect Mult Living                 Review of Systems  Constitutional: Negative for fever.  HENT: Positive for congestion, nosebleeds and rhinorrhea. Negative for sore throat.   Eyes: Negative.   Respiratory: Negative for chest tightness and shortness of breath.   Cardiovascular: Negative for chest pain.  Gastrointestinal: Negative for nausea and abdominal pain.  Genitourinary: Negative.   Musculoskeletal: Negative for arthralgias, joint swelling and neck pain.  Skin: Negative.  Negative for rash and wound.   Neurological: Negative for dizziness, weakness, light-headedness, numbness and headaches.  Psychiatric/Behavioral: Negative.     Allergies  Shellfish allergy  Home Medications   Current Outpatient Rx  Name  Route  Sig  Dispense  Refill  . atenolol (TENORMIN) 25 MG tablet   Oral   Take 25 mg by mouth daily.           . calcium-vitamin D (OSCAL WITH D) 500-200 MG-UNIT per tablet   Oral   Take 1 tablet by mouth.         . cefdinir (OMNICEF) 300 MG capsule   Oral   Take 300 mg by mouth 2 (two) times daily. Starting 08/12/2013 x 7 days.         . digoxin (LANOXIN) 0.125 MG tablet   Oral   Take 125 mcg by mouth daily.           . Multiple Vitamin (MULTIVITAMIN PO)   Oral   Take 1 tablet by mouth daily.           Marland Kitchen warfarin (COUMADIN) 5 MG tablet   Oral   Take 5 mg by mouth See admin instructions. Patient takes 5 mg Monday thru Friday and takes 10 mg on Saturday and Sunday.          BP 130/53  Pulse 83  Temp(Src) 97.5 F (36.4 C) (Oral)  Resp 16  Wt 140 lb (  63.504 kg)  SpO2 92% Physical Exam  Nursing note and vitals reviewed. Constitutional: She appears well-developed and well-nourished.  HENT:  Head: Normocephalic and atraumatic.  Nose: Epistaxis is observed.  Left nares,  Congestion with clear rhinorhea and slight amount of bright red blood,  No active bleeding.  Eyes: Conjunctivae are normal.  Neck: Normal range of motion.  Cardiovascular: Normal rate, regular rhythm, normal heart sounds and intact distal pulses.   Pulmonary/Chest: Effort normal and breath sounds normal. She has no wheezes.  Abdominal: Soft. Bowel sounds are normal. There is no tenderness.  Musculoskeletal: Normal range of motion.  Neurological: She is alert.  Skin: Skin is warm and dry.  Psychiatric: She has a normal mood and affect.    ED Course  Procedures (including critical care time) Labs Review Labs Reviewed  CBC - Abnormal; Notable for the following:    Platelets 129  (*)    All other components within normal limits  PROTIME-INR - Abnormal; Notable for the following:    Prothrombin Time 47.3 (*)    INR 5.43 (*)    All other components within normal limits   Imaging Review No results found.  EKG Interpretation   None       MDM   1. Left-sided epistaxis   2. Supratherapeutic INR    Pt given 2 puffs of afrin,  Continued to apply pressure with no active bleeding in ed.  She was advised to hold her Coumadin for today and tomorrow , with 2.5 mg taken on day 3, and should get a recheck INR either Thursday or Friday.  She was also advised to advise her PCP of today's finding.  Afrin given to patient for home use if symptoms return, also instructed in proper a home epistaxis treatment.   Burgess Amor, PA-C 08/14/13 1005

## 2013-08-17 LAB — PROTIME-INR: INR: 1.7 — AB (ref 0.9–1.1)

## 2013-09-07 LAB — PROTIME-INR: INR: 1.9 — AB (ref 0.9–1.1)

## 2013-10-31 LAB — PROTIME-INR: INR: 2.9 — AB (ref 0.9–1.1)

## 2013-12-11 LAB — PROTIME-INR: INR: 3.4 — AB (ref 0.9–1.1)

## 2014-03-04 LAB — PROTIME-INR: INR: 2 — AB (ref 0.9–1.1)

## 2014-08-21 ENCOUNTER — Other Ambulatory Visit: Payer: Self-pay | Admitting: Obstetrics and Gynecology

## 2014-08-21 LAB — HM PAP SMEAR: HM Pap smear: NEGATIVE

## 2014-08-23 LAB — CYTOLOGY - PAP

## 2015-12-25 ENCOUNTER — Encounter: Payer: Self-pay | Admitting: Gastroenterology

## 2016-11-23 LAB — PROTIME-INR: INR: 2.6 — AB (ref 0.9–1.1)

## 2017-01-11 LAB — PROTIME-INR: INR: 3.2 — AB (ref 0.9–1.1)

## 2017-02-22 LAB — PROTIME-INR: INR: 3.7 — AB (ref 0.9–1.1)

## 2017-03-08 LAB — PROTIME-INR: INR: 2.5 — AB (ref 0.9–1.1)

## 2017-04-28 LAB — PROTIME-INR: INR: 2.2 — AB (ref 0.9–1.1)

## 2017-07-05 LAB — PROTIME-INR: INR: 2.3 — AB (ref 0.9–1.1)

## 2017-09-06 LAB — BASIC METABOLIC PANEL
BUN: 16 (ref 4–21)
CO2: 25 — AB (ref 13–22)
Chloride: 106 (ref 99–108)
Creatinine: 0.8 (ref <=1.1)
Glucose: 105
Potassium: 4.3 (ref 3.4–5.3)
Sodium: 139 (ref 137–147)

## 2017-09-06 LAB — CBC AND DIFFERENTIAL
HCT: 41 (ref 36–46)
Hemoglobin: 13.9 (ref 12.0–16.0)
Neutrophils Absolute: 3276
Platelets: 126 — AB (ref 150–399)
WBC: 5.2

## 2017-09-06 LAB — HEPATIC FUNCTION PANEL
ALT: 27 (ref 7–35)
AST: 24 (ref 13–35)
Alkaline Phosphatase: 61 (ref 25–125)

## 2017-09-06 LAB — TSH: TSH: 2.68 (ref <=5.90)

## 2017-09-06 LAB — COMPREHENSIVE METABOLIC PANEL
Albumin: 4.5 (ref 3.5–5.0)
Calcium: 9.3 (ref 8.7–10.7)
GFR calc Af Amer: 105
GFR calc non Af Amer: 91
Globulin: 2.4

## 2017-09-06 LAB — LIPID PANEL
Cholesterol: 181 (ref 0–200)
HDL: 39 (ref 35–70)
LDL Cholesterol: 117
Triglycerides: 142 (ref 40–160)

## 2017-09-06 LAB — CBC: RBC: 4.46 (ref 3.87–5.11)

## 2017-09-22 LAB — HEMOGLOBIN A1C: Hemoglobin A1C: 6

## 2017-12-07 LAB — PROTIME-INR: INR: 3.2 — AB (ref 0.9–1.1)

## 2018-02-13 LAB — PROTIME-INR: INR: 2.5 — AB (ref 0.9–1.1)

## 2018-04-05 LAB — PROTIME-INR: INR: 3 — AB (ref 0.9–1.1)

## 2018-05-01 DIAGNOSIS — I484 Atypical atrial flutter: Secondary | ICD-10-CM | POA: Insufficient documentation

## 2018-06-13 LAB — PROTIME-INR: INR: 2.3 — AB (ref 0.9–1.1)

## 2018-07-25 LAB — PROTIME-INR: INR: 2.7 — AB (ref 0.9–1.1)

## 2018-11-23 LAB — PROTIME-INR: INR: 2.3 — AB (ref 0.9–1.1)

## 2018-12-22 LAB — PROTIME-INR: INR: 3.2 — AB (ref 0.9–1.1)

## 2019-05-02 LAB — PROTIME-INR: INR: 1.6 — AB (ref 0.9–1.1)

## 2019-05-30 LAB — PROTIME-INR: INR: 4.2 — AB (ref 0.9–1.1)

## 2019-07-05 LAB — PROTIME-INR

## 2019-08-17 LAB — PROTIME-INR
INR: 2.7 — AB (ref 0.9–1.1)
Protime: 27.2 — AB (ref 10.0–13.8)

## 2019-08-17 LAB — POCT INR: INR: 2.7 — AB (ref <=1.1)

## 2019-08-23 ENCOUNTER — Other Ambulatory Visit: Payer: Self-pay

## 2019-08-23 DIAGNOSIS — Z20822 Contact with and (suspected) exposure to covid-19: Secondary | ICD-10-CM

## 2019-08-26 LAB — NOVEL CORONAVIRUS, NAA: SARS-CoV-2, NAA: NOT DETECTED

## 2019-09-28 DIAGNOSIS — H10402 Unspecified chronic conjunctivitis, left eye: Secondary | ICD-10-CM

## 2019-09-28 DIAGNOSIS — I38 Endocarditis, valve unspecified: Secondary | ICD-10-CM | POA: Insufficient documentation

## 2019-09-28 DIAGNOSIS — R011 Cardiac murmur, unspecified: Secondary | ICD-10-CM

## 2019-11-28 ENCOUNTER — Other Ambulatory Visit: Payer: Self-pay

## 2019-11-28 ENCOUNTER — Encounter: Payer: Self-pay | Admitting: Family Medicine

## 2019-11-28 ENCOUNTER — Ambulatory Visit: Payer: BC Managed Care – PPO | Admitting: Family Medicine

## 2019-11-28 VITALS — BP 139/76 | HR 68 | Temp 97.6°F | Ht 62.0 in | Wt 159.2 lb

## 2019-11-28 DIAGNOSIS — R011 Cardiac murmur, unspecified: Secondary | ICD-10-CM | POA: Diagnosis not present

## 2019-11-28 DIAGNOSIS — Z23 Encounter for immunization: Secondary | ICD-10-CM | POA: Diagnosis not present

## 2019-11-28 DIAGNOSIS — D696 Thrombocytopenia, unspecified: Secondary | ICD-10-CM | POA: Diagnosis not present

## 2019-11-28 DIAGNOSIS — R7309 Other abnormal glucose: Secondary | ICD-10-CM

## 2019-11-28 NOTE — Patient Instructions (Addendum)
COVID-19 Vaccine Information can be found at: [email or web address].uk For questions related to vaccine distribution or appointments, please email vaccine@ [email or web address] or call [phone].   Fasting labwork   If you have lab work done today you will be contacted with your lab results within the next 2 weeks.  If you have not heard from [place] then please contact us. The fastest way to get your results is to register for My Chart.   IF you received an x-ray today, you will receive an invoice from [facility]. Please contact [facility] at [phone] with questions or concerns regarding your invoice.   IF you received labwork today, you will receive an invoice from [place]. Please contact [facility] at [phone] with questions or concerns regarding your invoice.   Our billing staff will not be able to assist you with questions regarding bills from these companies.  You will be contacted with the lab results as soon as they are available. The fastest way to get your results is to activate your My Chart account. Instructions are located on the last page of this paperwork. If you have not heard from [place] regarding the results in 2 weeks, please contact this office.

## 2019-11-28 NOTE — Progress Notes (Signed)
New Patient Office Visit  Subjective:  Patient ID: Brandi Fischer, female    DOB: 09/07/1966  Age: 54 y.o. MRN: [id number]  CC:  Chief Complaint  Patient presents with  . Establish Care    Have not been seen here in a while. See Dr Rosita Fire with [facility] is her cardiologist last seen him in Nov 2020. Last had pace maker checked in Jan    HPI Brandi Fischer presents for CHF/heart murmur-abnormal rhythm requiring cardioversion. Pt with h/o congenital heart defect with surgery in 26 and pacemaker first in 31. Pt with cardioversion 2019. Pt states she is able to walk but feels increase intensity work out likely source of change in heart 2 years ago. Pt uses coumadin managed by cardio and metoprolol 25mg  daily for rate control.  Sees GYN for pap/mammo/DEXA  Past Medical History:  Diagnosis Date  . Acute tonsillitis   . Cardiac murmur, unspecified   . CHF (congestive heart failure) ([place])   . Congenital heart defect 1967  . Endocarditis, valve unspecified   . Heart disease   . Hyperlipidemia   . 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 Unspecified chronic conjunctivitis, left eye     Past Surgical History:  Procedure Laterality Date  . ABDOMINAL HYSTERECTOMY    . APPENDECTOMY  10/2010  . cardiac pacemaker procedure    . CARDIAC SURGERY      Family History  Problem Relation Age of Onset  . Stroke Father   . Diabetes Maternal Grandfather   . Diabetes Paternal Grandmother     Social History   Socioeconomic History  . Marital status: Married    Spouse name: Not on file  . Number of children: Not on file  . Years of education: Not on file  . Highest education level: Not on file  Occupational History  . Not on file  Tobacco Use  . Smoking status: Never Smoker  . Smokeless tobacco: Never Used  Substance and Sexual Activity  . Alcohol use: No  . Drug use: No  . Sexual activity: Not on file  Other Topics Concern  . Not on file  Social History Narrative  . Not on file    Social Determinants of Health   Financial Resource Strain:   . Difficulty of Paying Living Expenses: Not on file  Food Insecurity:   . Worried About Charity fundraiser in the Last Year: Not on file  . Ran Out of Food in the Last Year: Not on file  Transportation Needs:   . Lack of Transportation (Medical): Not on file  . Lack of Transportation (Non-Medical): Not on file  Physical Activity:   . Days of Exercise per Week: Not on file  . Minutes of Exercise per Session: Not on file  Stress:   . Feeling of Stress : Not on file  Social Connections:   . Frequency of Communication with Friends and Family: Not on file  . Frequency of Social Gatherings with Friends and Family: Not on file  . Attends Religious Services: Not on file  . Active Member of Clubs or Organizations: Not on file  . Attends Archivist Meetings: Not on file  . Marital Status: Not on file  Intimate Partner Violence:   . Fear of Current or Ex-Partner: Not on file  . Emotionally Abused: Not on file  . Physically Abused: Not on file  . Sexually Abused: Not on file    ROS Review of Systems  Constitutional: Negative.   HENT: Negative.   Eyes:       Contacts  Respiratory: Negative.   Cardiovascular: Negative for leg swelling (congenital heart defect).       Congenital heart defect  Gastrointestinal: Negative.   Endocrine: Negative.   Genitourinary: Negative.   Musculoskeletal: Negative.   Skin: Negative.   Allergic/Immunologic: Negative.   Neurological: Negative.   Hematological: Negative.        Thrombocytopenia  Psychiatric/Behavioral: Negative.     Objective:   Today's Vitals: BP 139/76 (BP Location: Right Arm, Patient Position: Sitting, Cuff Size: Normal)   Pulse 68   Temp 97.6 F (36.4 C) (Temporal)   Ht 5\' 2"  (1.575 m)   Wt 159 lb 3.2 oz (72.2 kg)   SpO2 97%   BMI 29.12 kg/m   Physical Exam Constitutional:      Appearance: Normal appearance.  HENT:     Head: Normocephalic  and atraumatic.  Cardiovascular:     Rate and Rhythm: Normal rate and regular rhythm.     Pulses: Normal pulses.     Heart sounds: Normal heart sounds.  Pulmonary:     Effort: Pulmonary effort is normal.     Breath sounds: Normal breath sounds.  Musculoskeletal:        General: Normal range of motion.  Neurological:     Mental Status: She is alert and oriented to person, place, and time.  Psychiatric:        Mood and Affect: Mood normal.        Behavior: Behavior normal.     Assessment & Plan:  1. Elevated hemoglobin A1c - Hemoglobin A1c 2. Cardiac murmur, unspecified Heart surgery-open heart surgery - Lipid panel - COMPLETE METABOLIC PANEL WITH GFR - CBC w/Diff/Platelet 3 Thrombocytopenia (HCC) - CBC w/Diff/Platelet  Outpatient Encounter Medications as of 11/28/2019  Medication Sig  . calcium-vitamin D (OSCAL WITH D) 500-200 MG-UNIT per tablet Take 1 tablet by mouth.  . METOPROLOL SUCCINATE ER PO Take 25 mg by mouth 1 day or 1 dose.  . Multiple Vitamin (MULTIVITAMIN PO) Take 1 tablet by mouth daily.    Marland Kitchen warfarin (COUMADIN) 5 MG tablet Take 5 mg by mouth See admin instructions. Patient takes 5 mg Monday thru Friday and takes 10 mg on Saturday and Sunday.  . [DISCONTINUED] atenolol (TENORMIN) 25 MG tablet Take 25 mg by mouth daily.    . [DISCONTINUED] cefdinir (OMNICEF) 300 MG capsule Take 300 mg by mouth 2 (two) times daily. Starting 08/12/2013 x 7 days.  . [DISCONTINUED] digoxin (LANOXIN) 0.125 MG tablet Take 125 mcg by mouth daily.     No facility-administered encounter medications on file as of 11/28/2019.   1. Elevated hemoglobin A1c - Hemoglobin A1c  2. Cardiac murmur, unspecified Cardio monitoring - Lipid panel - COMPLETE METABOLIC PANEL WITH GFR - CBC w/Diff/Platelet  3. Thrombocytopenia (HCC) - CBC w/Diff/Platelet  4. Need for prophylactic vaccination against diphtheria-tetanus-pertussis (DTP) - Tdap vaccine greater than or equal to 7yo IM Follow-up:  yearly 18min discussing specialty care, medical history, physical exam, assessment, plan, COVID vaccine, TdaP vaccine Schyler Butikofer Hannah Beat, MD

## 2019-12-21 LAB — HEMOGLOBIN A1C
Hgb A1c MFr Bld: 6.2 % of total Hgb — ABNORMAL HIGH (ref <5.7)
Mean Plasma Glucose: 131 (calc)
eAG (mmol/L): 7.3 (calc)

## 2019-12-21 LAB — CBC WITH DIFFERENTIAL/PLATELET
Absolute Monocytes: 430 cells/uL (ref 200–950)
Basophils Absolute: 52 cells/uL (ref 0–200)
Basophils Relative: 1.2 %
Eosinophils Absolute: 90 cells/uL (ref 15–500)
Eosinophils Relative: 2.1 %
HCT: 42.7 % (ref 35.0–45.0)
Hemoglobin: 14.6 g/dL (ref 11.7–15.5)
Lymphs Abs: 1109 cells/uL (ref 850–3900)
MCH: 31.9 pg (ref 27.0–33.0)
MCHC: 34.2 g/dL (ref 32.0–36.0)
MCV: 93.2 fL (ref 80.0–100.0)
MPV: 11.4 fL (ref 7.5–12.5)
Monocytes Relative: 10 %
Neutro Abs: 2619 cells/uL (ref 1500–7800)
Neutrophils Relative %: 60.9 %
Platelets: 120 10*3/uL — ABNORMAL LOW (ref 140–400)
RBC: 4.58 10*6/uL (ref 3.80–5.10)
RDW: 13.6 % (ref 11.0–15.0)
Total Lymphocyte: 25.8 %
WBC: 4.3 10*3/uL (ref 3.8–10.8)

## 2019-12-21 LAB — COMPLETE METABOLIC PANEL WITH GFR
AG Ratio: 1.7 (calc) (ref 1.0–2.5)
ALT: 22 U/L (ref 6–29)
AST: 25 U/L (ref 10–35)
Albumin: 4.5 g/dL (ref 3.6–5.1)
Alkaline phosphatase (APISO): 58 U/L (ref 37–153)
BUN: 12 mg/dL (ref 7–25)
CO2: 27 mmol/L (ref 20–32)
Calcium: 9.8 mg/dL (ref 8.6–10.4)
Chloride: 105 mmol/L (ref 98–110)
Creat: 0.78 mg/dL (ref 0.50–1.05)
GFR, Est African American: 100 mL/min/{1.73_m2} (ref >=60)
GFR, Est Non African American: 86 mL/min/{1.73_m2} (ref >=60)
Globulin: 2.6 g/dL (calc) (ref 1.9–3.7)
Glucose, Bld: 109 mg/dL — ABNORMAL HIGH (ref 65–99)
Potassium: 4.5 mmol/L (ref 3.5–5.3)
Sodium: 139 mmol/L (ref 135–146)
Total Bilirubin: 0.8 mg/dL (ref 0.2–1.2)
Total Protein: 7.1 g/dL (ref 6.1–8.1)

## 2019-12-21 LAB — LIPID PANEL
Cholesterol: 153 mg/dL (ref <200)
HDL: 31 mg/dL — ABNORMAL LOW (ref >=50)
LDL Cholesterol (Calc): 98 mg/dL (calc)
Non-HDL Cholesterol (Calc): 122 mg/dL (calc) (ref <130)
Total CHOL/HDL Ratio: 4.9 (calc) (ref <5.0)
Triglycerides: 139 mg/dL (ref <150)

## 2019-12-25 ENCOUNTER — Encounter: Payer: Self-pay | Admitting: Emergency Medicine

## 2020-01-10 ENCOUNTER — Telehealth: Payer: Self-pay | Admitting: *Deleted

## 2020-01-10 NOTE — Telephone Encounter (Signed)
Gave Ms Henjum a new patient appointment with Dr. Denman George on Friday 01-18-20 at 1045. Pt. verbalized understanding.

## 2020-01-10 NOTE — Telephone Encounter (Signed)
Attempted to reach the patient to schedule a new patient appt. Left the patient a message to call the office back. 

## 2020-01-18 ENCOUNTER — Other Ambulatory Visit: Payer: Self-pay

## 2020-01-18 ENCOUNTER — Inpatient Hospital Stay: Payer: BC Managed Care – PPO

## 2020-01-18 ENCOUNTER — Encounter: Payer: Self-pay | Admitting: Gynecologic Oncology

## 2020-01-18 ENCOUNTER — Inpatient Hospital Stay: Payer: BC Managed Care – PPO | Attending: Gynecologic Oncology | Admitting: Gynecologic Oncology

## 2020-01-18 VITALS — BP 146/86 | HR 106 | Temp 98.7°F | Resp 17 | Ht 62.0 in | Wt 157.4 lb

## 2020-01-18 DIAGNOSIS — Z90710 Acquired absence of both cervix and uterus: Secondary | ICD-10-CM | POA: Diagnosis not present

## 2020-01-18 DIAGNOSIS — Z90721 Acquired absence of ovaries, unilateral: Secondary | ICD-10-CM | POA: Insufficient documentation

## 2020-01-18 DIAGNOSIS — R188 Other ascites: Secondary | ICD-10-CM | POA: Diagnosis present

## 2020-01-18 LAB — BASIC METABOLIC PANEL
Anion gap: 14 (ref 5–15)
BUN: 13 mg/dL (ref 6–20)
CO2: 22 mmol/L (ref 22–32)
Calcium: 10.1 mg/dL (ref 8.9–10.3)
Chloride: 105 mmol/L (ref 98–111)
Creatinine, Ser: 0.87 mg/dL (ref 0.44–1.00)
GFR calc Af Amer: >60 mL/min (ref >60)
GFR calc non Af Amer: >60 mL/min (ref >60)
Glucose, Bld: 121 mg/dL — ABNORMAL HIGH (ref 70–99)
Potassium: 4.4 mmol/L (ref 3.5–5.1)
Sodium: 141 mmol/L (ref 135–145)

## 2020-01-18 NOTE — Progress Notes (Signed)
Consult Note: Gyn-Onc  Consult was requested by Dr. Radene Knee for the evaluation of Brandi Fischer 54 y.o. female  CC:  Chief Complaint  Patient presents with  . Other ascites    New patient    Assessment/Plan:  Brandi Fischer  is a 54 y.o.  year old with possible ascites on transvaginal US. She is BRCA negative by [facility] testing in 2019 and average risk for ovarian/FT/primary peritoneal cancer based on a single second degree relative with this history. Based on the above clinical picture, screening for ovarian cancer is not recommended for this patient.  I discussed with the patient the quandary that screening for ovarian cancer plays.  I have explained that screening for ovarian cancer in average risk patients has never been shown to improve survival or stage shift detection of ovarian cancer to an early stage.  Therefore the current recommendations and guidelines are that ultrasound screenings with Ca1 25 assessments should only be performed for patients with known deleterious germline mutations which place them at increased risk for ovarian cancer and in whom risk reducing surgery is not desired.  I explained that the findings of ascites on an office ultrasound are unclear and nonspecific.  We will order a CT scan of the abdomen and pelvis to better quantify this phenomenon.  I am reassured by the underlying normal Ca1 25 as it is extremely rare and unlikely for disseminated ovarian cancer causing ascites to be associated with a normal Ca1 25.  It is reassuring that her ovary was not visualized on ultrasound as this indicates it is likely small and normal in size.  If ascites is in fact confirmed on CT scan of the abdomen and pelvis and no associated carcinomatosis is identified, my recommendation would be for peritoneal aspiration and cytologic testing with biochemistry testing to ascertain the nature and etiology of the ascites.  If however the CT scan fails to show significant  intraperitoneal fluid, her ultrasound findings can be attributed to a false positive.  With respect to risk reducing surgery, this patient who is at average risk for ovarian cancer is not a candidate for a surgery to electively remove her ovary.  We discussed the evidence that suggests that removal of normal ovarian tissue in women before the age of 63 who are at average risk for ovarian cancer increases their all cause mortality and decreases life expectancy, likely due to accelerated aging of the cardiovascular system.  Additionally her history of cardiac surgery and need to be treated with Coumadin place her at increased risk for complication at the time of surgery and elective surgery should be avoided for this patient.  If CT scans are normal she can be released to her gynecologist for ongoing routine wellness care which should not include screening ultrasounds and Ca1 25's.  I would recommend reserving ultrasounds in this patient exclusively for the development of new concerning symptoms of adnexal mass.  HPI: Brandi Fischer is a very pleasant 54 year old P0 who was seen in consultation at the request of Dr Radene Knee for evaluation of possible ascites/posterior cul de sac fluid on ultrasound.   The patient reported that she sees Dr. Radene Knee routinely for annual gynecologic care she has a remote history of a total abdominal hysterectomy and left salpingonephrectomy in the year 2000 for endometriosis including a left ovarian endometrioma.  That procedure was uncomplicated.  She subsequently underwent an emergent appendectomy and 2013 for what might have been partial rupture of the appendix.  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  She had a pacemaker placed due to the surgery and the pacemaker lead is in a small vessel with risk for thrombus and therefore she takes Coumadin maintaining an INR of 2.5.  Her most recent pacemaker surgery was in  2014.  She has excellent cardiovascular health otherwise with no shortness of breath on exertion.  Her family history is remarkable for a paternal aunt who has a possible history of ovarian cancer.  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  She survived that disease and died at an old age of other causes.  The patient also reported that she has a paternal aunt with breast cancer (not the same aunt), a paternal cousin with breast cancer who is that aunts daughter, and a paternal grandmother with history of breast cancer.  She is unaware of any of these family members were tested for BRCA mutations.  The patient herself was tested with myriad testing in 2019 for BRCA germline mutations and was confirmed to be negative.  The patient is living with her husband and works at [employer] in [place].  Brandi. Fischer denied symptoms concerning for bloating, early satiety, difficulty eating, pelvic or abdominal pain.  She denies vaginal bleeding.  She has noticed some gradual weight gain over time which she attributed to menopause.  She was being seen by Dr. Radene Knee for routine annual wellness visit in early April 2021 and due to reporting of her family history of ovarian cancer in 1 second-degree relative, a transvaginal ultrasound scan was performed on January 07, 2020.  This revealed nonvisualized ovaries, there was fluid in the cul-de-sac, there was prominent distended colon in the left adnexal area.  Ca1 25 was drawn on the same day of January 07, 2020 and was normal at 26.4.  Current Meds:  Outpatient Encounter Medications as of 01/18/2020  Medication Sig  . calcium-vitamin D (OSCAL WITH D) 500-200 MG-UNIT per tablet Take 1 tablet by mouth.  . METOPROLOL SUCCINATE ER PO Take 25 mg by mouth 1 day or 1 dose.  . Multiple Vitamin (MULTIVITAMIN PO) Take 1 tablet by mouth daily.    Marland Kitchen warfarin (COUMADIN) 5 MG tablet Take 5 mg by mouth See admin  instructions. Patient takes 5 mg Monday thru Friday and takes 10 mg on Saturday and Sunday.   No facility-administered encounter medications on file as of 01/18/2020.    Allergy:  Allergies  Allergen Reactions  . Shellfish Allergy     Hives    Social Hx:   Social History   Socioeconomic History  . Marital status: Married    Spouse name: Not on file  . Number of children: Not on file  . Years of education: Not on file  . Highest education level: Not on file  Occupational History  . Not on file  Tobacco Use  . Smoking status: Never Smoker  . Smokeless tobacco: Never Used  Substance and Sexual Activity  . Alcohol use: No  . Drug use: No  . Sexual activity: Not on file  Other Topics Concern  . Not on file  Social History Narrative  . Not on file   Social Determinants of Health   Financial Resource Strain:   . Difficulty of Paying Living Expenses:   Food Insecurity:   . Worried About Charity fundraiser in the Last Year:   .  Ran Out of Food in the Last Year:   Transportation Needs:   . Film/video editor (Medical):   Marland Kitchen Lack of Transportation (Non-Medical):   Physical Activity:   . Days of Exercise per Week:   . Minutes of Exercise per Session:   Stress:   . Feeling of Stress :   Social Connections:   . Frequency of Communication with Friends and Family:   . Frequency of Social Gatherings with Friends and Family:   . Attends Religious Services:   . Active Member of Clubs or Organizations:   . Attends Archivist Meetings:   Marland Kitchen Marital Status:   Intimate Partner Violence:   . Fear of Current or Ex-Partner:   . Emotionally Abused:   Marland Kitchen Physically Abused:   . Sexually Abused:     Past Surgical Hx:  Past Surgical History:  Procedure Laterality Date  . ABDOMINAL HYSTERECTOMY    . APPENDECTOMY  10/2010  . cardiac pacemaker procedure    . CARDIAC SURGERY      Past Medical Hx:  Past Medical History:  Diagnosis Date  . Acute tonsillitis   .  Cardiac murmur, unspecified   . CHF (congestive heart failure) ([place])   . Congenital heart defect 1967  . Endocarditis, valve unspecified   . Heart disease   . History of cardioversion   . Hyperlipidemia   . 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 Unspecified chronic conjunctivitis, left eye     Past Gynecological History:  See HPI No LMP recorded. Patient has had a hysterectomy.  Family Hx:  Family History  Problem Relation Age of Onset  . Stroke Father   . Diabetes Maternal Grandfather   . Diabetes Paternal Grandmother   . Breast cancer Paternal Grandmother   . Breast cancer Paternal Aunt     Review of Systems:  Constitutional  Feels well,   ENT Normal appearing ears and nares bilaterally Skin/Breast  No rash, sores, jaundice, itching, dryness Cardiovascular  No chest pain, shortness of breath, or edema  Pulmonary  No cough or wheeze.  Gastro Intestinal  No nausea, vomitting, or diarrhoea. No bright red blood per rectum, no abdominal pain, change in bowel movement, or constipation.  Genito Urinary  No frequency, urgency, dysuria, no bleeding Musculo Skeletal  No myalgia, arthralgia, joint swelling or pain  Neurologic  No weakness, numbness, change in gait,  Psychology  No depression, anxiety, insomnia.   Vitals:  Blood pressure (!) 146/86, pulse (!) 106, temperature 98.7 F (37.1 C), temperature source Temporal, resp. rate 17, height 5' 2"  (1.575 m), weight 157 lb 6.4 oz (71.4 kg), SpO2 97 %.  Physical Exam: WD in NAD Neck  Supple NROM, without any enlargements.  Lymph Node Survey No cervical supraclavicular or inguinal adenopathy Cardiovascular  Pulse normal rate, regularity and rhythm. S1 and S2 normal.  Lungs  Clear to auscultation bilateraly, without wheezes/crackles/rhonchi. Good air movement.  Skin  No rash/lesions/breakdown  Psychiatry  Alert and oriented to person, place, and time  Abdomen  Normoactive bowel sounds, abdomen soft, non-tender and overweight  without evidence of hernia. No distension or dullness to percussion suggestive of significant ascites. Back No CVA tenderness Genito Urinary  Vulva/vagina: Normal external female genitalia.   No lesions. No discharge or bleeding.  Bladder/urethra:  No lesions or masses, well supported bladder  Vagina: smooth, regular, no palpable masses. No fluctuance.   Cervix and uterus surgically absent.  Adnexa: no palpable masses. Rectal  Good tone, no masses  no cul de sac nodularity.  Extremities  No bilateral cyanosis, clubbing or edema.   Thereasa Solo, MD  01/18/2020, 5:28 PM

## 2020-01-18 NOTE — Patient Instructions (Signed)
Given the lack of mutation in a BRCA gene, and your family history of a single 2nd degree relative with ovarian cancer history, you are considered at baseline risk for ovarian cancer (approximately 1 in 56). No screening tests (ultrasounds or labs) are recommended to look for ovarian cancer in patients who do not have a BRCA gene mutation. Therefore, moving forward, Dr Denman George does not recommend these screening tests.  To evaluate Dr Sherran Needs findings Dr Denman George will check a CT scan of the abdomen. If this does not show significant ascites, no further workup is necessary. If this shows significant ascites, an aspiration of the ascites may be necessary to evaluate for the type and cause of ascites.  Dr Denman George does not recommend that you have prophylactic removal of the right ovary (provided that it appears small/normal on CT scan) as this is associated with shortening life expectancy in women of average risk for ovarian cancer.

## 2020-01-22 ENCOUNTER — Other Ambulatory Visit: Payer: Self-pay | Admitting: Emergency Medicine

## 2020-01-22 ENCOUNTER — Telehealth: Payer: Self-pay | Admitting: Family Medicine

## 2020-01-22 DIAGNOSIS — R011 Cardiac murmur, unspecified: Secondary | ICD-10-CM

## 2020-01-22 DIAGNOSIS — I10 Essential (primary) hypertension: Secondary | ICD-10-CM

## 2020-01-22 MED ORDER — METOPROLOL SUCCINATE ER 25 MG PO TB24: 25.0000 mg | ORAL_TABLET | ORAL | 0 refills | Status: DC

## 2020-01-22 NOTE — Telephone Encounter (Signed)
Rx sent 

## 2020-01-22 NOTE — Telephone Encounter (Signed)
Patient is requesting a refill on the following medication. She is working on finding a new pcp  METOPROLOL SUCCINATE ER PO    [place], [place]. Ruthe Mannan Phone:  [phone]  Fax:  [phone]

## 2020-01-24 ENCOUNTER — Ambulatory Visit (HOSPITAL_COMMUNITY)
Admission: RE | Admit: 2020-01-24 | Discharge: 2020-01-24 | Disposition: A | Discharge status: Home / Self Care | Payer: BC Managed Care – PPO | Source: Ambulatory Visit | Attending: Gynecologic Oncology | Admitting: Gynecologic Oncology

## 2020-01-24 ENCOUNTER — Encounter (HOSPITAL_COMMUNITY): Payer: Self-pay

## 2020-01-24 ENCOUNTER — Other Ambulatory Visit: Payer: Self-pay

## 2020-01-24 DIAGNOSIS — R188 Other ascites: Secondary | ICD-10-CM

## 2020-01-24 MED ORDER — IOHEXOL 300 MG/ML  SOLN
100.0000 mL | Freq: Once | INTRAMUSCULAR | Status: AC | PRN
  Administered 2020-01-24: 100 mL via INTRAVENOUS

## 2020-01-24 MED ORDER — SODIUM CHLORIDE (PF) 0.9 % IJ SOLN
INTRAMUSCULAR | Status: AC
  Filled 2020-01-24: qty 50

## 2020-01-25 ENCOUNTER — Telehealth: Payer: Self-pay

## 2020-01-25 NOTE — Telephone Encounter (Signed)
Told Ms Brandi Fischer that there is no sign of cancer per Brandi Cross,NP. 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 Dr. [name] recommends seeing GI. She has diverticulosis with out Diverticulitis on the left side. There is also gallbladder changes which can also be evaluated by GI. She is not experiencing any Gallbladder symptoms, therefore, Dr. [name] would not refer to a general surgeon.  She does not need to follow up with Dr. [name].  Ms Brandi Fischer will call Dr. [name] office to set up an appointment to address the findings on the CT scan and is due for a colonoscopy. Told her that the report of the CT scan will be routed to him so he is aware of findings. Pt verbalized understanding.

## 2020-02-07 ENCOUNTER — Encounter: Payer: Self-pay | Admitting: Gastroenterology

## 2020-02-11 ENCOUNTER — Other Ambulatory Visit: Payer: Self-pay

## 2020-02-11 DIAGNOSIS — R011 Cardiac murmur, unspecified: Secondary | ICD-10-CM

## 2020-02-11 DIAGNOSIS — I10 Essential (primary) hypertension: Secondary | ICD-10-CM

## 2020-02-11 MED ORDER — METOPROLOL SUCCINATE ER 25 MG PO TB24: 25.0000 mg | ORAL_TABLET | Freq: Every day | ORAL | 1 refills | Status: DC

## 2020-02-15 ENCOUNTER — Encounter: Payer: Self-pay | Admitting: Gastroenterology

## 2020-02-20 ENCOUNTER — Ambulatory Visit: Payer: BC Managed Care – PPO | Admitting: Family Medicine

## 2020-02-20 ENCOUNTER — Encounter: Payer: Self-pay | Admitting: Family Medicine

## 2020-02-20 ENCOUNTER — Other Ambulatory Visit: Payer: Self-pay

## 2020-02-20 VITALS — BP 116/78 | HR 84 | Temp 97.6°F | Wt 164.4 lb

## 2020-02-20 DIAGNOSIS — R05 Cough: Secondary | ICD-10-CM | POA: Diagnosis not present

## 2020-02-20 DIAGNOSIS — R600 Localized edema: Secondary | ICD-10-CM | POA: Diagnosis not present

## 2020-02-20 DIAGNOSIS — I10 Essential (primary) hypertension: Secondary | ICD-10-CM

## 2020-02-20 DIAGNOSIS — R059 Cough, unspecified: Secondary | ICD-10-CM

## 2020-02-20 DIAGNOSIS — R011 Cardiac murmur, unspecified: Secondary | ICD-10-CM

## 2020-02-20 NOTE — Progress Notes (Signed)
Established Patient Office Visit  Subjective:  Patient ID: Brandi Fischer, female    DOB: 14-Jun-1966  Age: 54 y.o. MRN: [id number]  CC:  Chief Complaint  Patient presents with  . Allergic Rhinitis     nasal drainage, cough from postnasal    HPI Brandi Fischer presents for cough 10 days ago. Pt states dry cough-non productive Congestion -nasal. Concern for allergies-using allegra COVID  Vaccine x 2 shots-4 weeks ago  SOB with exertion-worse over the last 2 weeks-no h/o of lasix 5/15 pacer check normal-reviewed [place] note LE edema-worse over last 2 weeks-resolves overnight-pt drives to [place] and sits at her desk daily CHF-/Congenital heart defect/endocarditis by history-sees Dr. Soyla [place]-long term-no recent OV, no recent echo Pt using coumadin-INR per [facility].    Past Medical History:  Diagnosis Date  . Acute tonsillitis   . Cardiac murmur, unspecified   . CHF (congestive heart failure) ([place])   . Congenital heart defect 1967  . Endocarditis, valve unspecified   . Heart disease   . History of cardioversion   . Hyperlipidemia   . 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 Unspecified chronic conjunctivitis, left eye     Past Surgical History:  Procedure Laterality Date  . ABDOMINAL HYSTERECTOMY    . APPENDECTOMY  10/2010  . cardiac pacemaker procedure    . CARDIAC SURGERY      Family History  Problem Relation Age of Onset  . Stroke Father   . Diabetes Maternal Grandfather   . Diabetes Paternal Grandmother   . Breast cancer Paternal Grandmother   . Breast cancer Paternal Aunt     Social History   Socioeconomic History  . Marital status: Married    Spouse name: Not on file  . Number of children: Not on file  . Years of education: Not on file  . Highest education level: Not on file  Occupational History  . Not on file  Tobacco Use  . Smoking status: Never Smoker  . Smokeless tobacco: Never Used  Substance and Sexual Activity  . Alcohol use: No  .  Drug use: No  . Sexual activity: Not on file  Other Topics Concern  . Not on file  Social History Narrative  . Not on file   Social Determinants of Health   Financial Resource Strain:   . Difficulty of Paying Living Expenses:   Food Insecurity:   . Worried About Charity fundraiser in the Last Year:   . Arboriculturist in the Last Year:   Transportation Needs:   . Film/video editor (Medical):   Marland Kitchen Lack of Transportation (Non-Medical):   Physical Activity:   . Days of Exercise per Week:   . Minutes of Exercise per Session:   Stress:   . Feeling of Stress :   Social Connections:   . Frequency of Communication with Friends and Family:   . Frequency of Social Gatherings with Friends and Family:   . Attends Religious Services:   . Active Member of Clubs or Organizations:   . Attends Archivist Meetings:   Marland Kitchen Marital Status:   Intimate Partner Violence:   . Fear of Current or Ex-Partner:   . Emotionally Abused:   Marland Kitchen Physically Abused:   . Sexually Abused:     Outpatient Medications Prior to Visit  Medication Sig Dispense Refill  . calcium-vitamin D (OSCAL WITH D) 500-200 MG-UNIT per tablet Take 1 tablet by mouth.    Marland Kitchen  fexofenadine (ALLEGRA) 180 MG tablet Take 180 mg by mouth daily.    . metoprolol succinate (TOPROL-XL) 25 MG 24 hr tablet Take 1 tablet (25 mg total) by mouth daily. 90 tablet 1  . Multiple Vitamin (MULTIVITAMIN PO) Take 1 tablet by mouth daily.      Marland Kitchen warfarin (COUMADIN) 5 MG tablet Take 7.5 mg by mouth on Sunday/Thursday, and 5 mg by mouth all other days of the week- or as directed by the [facility] [phone]).    Marland Kitchen warfarin (COUMADIN) 5 MG tablet Take 5 mg by mouth See admin instructions. Patient takes 5 mg Monday thru Friday and takes 10 mg on Saturday and Sunday.     No facility-administered medications prior to visit.    Allergies  Allergen Reactions  . Shellfish Allergy     Hives    ROS Review of Systems  Constitutional:  Positive for appetite change and fatigue. Negative for fever.       Decrease in appetite-no loss of taste  HENT: Positive for congestion and sore throat. Negative for ear pain, sinus pressure and trouble swallowing.        Using cough drops  Eyes: Positive for itching.  Respiratory: Positive for cough and shortness of breath. Negative for chest tightness and wheezing.   Cardiovascular: Negative for chest pain.  Gastrointestinal: Negative for abdominal distention, abdominal pain, constipation, diarrhea, nausea and vomiting.  Genitourinary: Negative for dysuria.  Musculoskeletal: Negative for gait problem.  Skin: Negative for rash.  Allergic/Immunologic: Positive for environmental allergies.  Neurological: Negative for dizziness, weakness and headaches.  Psychiatric/Behavioral: Negative for agitation and sleep disturbance.      Objective:    Physical Exam  Constitutional: She is oriented to person, place, and time. She appears well-developed and well-nourished.  HENT:  Head: Normocephalic and atraumatic.  Right Ear: External ear normal.  Left Ear: External ear normal.  Nose: Mucosal edema present.  Eyes: Conjunctivae are normal.  Cardiovascular: Intact distal pulses. An irregular rhythm present.  Murmur heard.  Systolic murmur is present.  Diastolic murmur is present. Pulmonary/Chest: Effort normal and breath sounds normal.  Musculoskeletal:        General: Edema present.     Right ankle: Swelling present.     Left ankle: Swelling present.     Comments: 3+ pitting edema bilat LE ankle and lower legs  Neurological: She is alert and oriented to person, place, and time.  Psychiatric: She has a normal mood and affect. Her behavior is normal.    BP 116/78 (BP Location: Right Arm, Patient Position: Sitting)   Pulse 84   Temp 97.6 F (36.4 C) (Temporal)   Wt 164 lb 6.4 oz (74.6 kg)   SpO2 (!) 89%   BMI 30.07 kg/m  Wt Readings from Last 3 Encounters:  02/20/20 164 lb 6.4 oz  (74.6 kg)  01/18/20 157 lb 6.4 oz (71.4 kg)  11/28/19 159 lb 3.2 oz (72.2 kg)     Health Maintenance Due  Topic Date Due  . COLONOSCOPY  01/22/2020    Lab Results  Component Value Date   TSH 2.68 09/06/2017   Lab Results  Component Value Date   WBC 4.3 12/20/2019   HGB 14.6 12/20/2019   HCT 42.7 12/20/2019   MCV 93.2 12/20/2019   PLT 120 (L) 12/20/2019   Lab Results  Component Value Date   NA 141 01/18/2020   K 4.4 01/18/2020   CO2 22 01/18/2020   GLUCOSE 121 (H) 01/18/2020  BUN 13 01/18/2020   CREATININE 0.87 01/18/2020   BILITOT 0.8 12/20/2019   ALKPHOS 61 09/06/2017   AST 25 12/20/2019   ALT 22 12/20/2019   PROT 7.1 12/20/2019   ALBUMIN 4.5 09/06/2017   CALCIUM 10.1 01/18/2020   ANIONGAP 14 01/18/2020   Lab Results  Component Value Date   CHOL 153 12/20/2019   Lab Results  Component Value Date   HDL 31 (L) 12/20/2019   Lab Results  Component Value Date   LDLCALC 98 12/20/2019   Lab Results  Component Value Date   TRIG 139 12/20/2019   Lab Results  Component Value Date   CHOLHDL 4.9 12/20/2019   Lab Results  Component Value Date   HGBA1C 6.2 (H) 12/20/2019      Assessment & Plan:  1. Cardiac murmur, unspecified Concern for worsening symptoms of LE edema, irregular rhythm-h/o afib with conversion in the past, SOB with exertion, lung bases clear-no comparison tracing available for ecg completed today-afib with pvc, ST-Twave inversion  -V1-V6 - EKG 12-Lead  2. Essential hypertension, benign Stable on metoprolol. Pulse stable - EKG 12-Lead  3. Edema of both legs New finding-pt on no current medication for edema  4. Cough Dry-no rales, rhonchi or wheeze-concern for low o2 sat-89% -below baseline-worsen sob on exertion NO CP/No neck or shoulder pain. SOB with exertion. Pt taking coumadin. CT with lower lobes noted as  EXAM:01/24/20 CT ABDOMEN AND PELVIS WITH CONTRAST  TECHNIQUE: Multidetector CT imaging of the abdomen and pelvis was  performed using the standard protocol following bolus administration of intravenous contrast.  CONTRAST:  178mL OMNIPAQUE IOHEXOL 300 MG/ML  SOLN  COMPARISON:  11/23/2011  FINDINGS: Lower chest: Unremarkable Follow-up: Keep appt with GI for follow up-concern for gallstones and ventral hernia-may need surgical intervention but pt currently with no n/v/abdominal pain  Discussed with Dr. Rosita Fire by phone  concerns -symptoms-appt tomorrow at [facility] understand follow up and agrees to appointment. Will go to ER if worsening SOB/CP or other concerns. Note to be faxed to Dr. Rosita Fire. Time for history, exam, ecg, discussion with cardioology 13min Carlton Sweaney Hannah Beat, MD

## 2020-02-20 NOTE — Patient Instructions (Signed)
Consider trial of Xyzal instead of allegra for allergies Flonase -2 sprays to each nostril at night for allergies Appt with Dr. Rosita Fire tomorrow at the [facility] at 10-11am per Dr. Rosita Fire /Dr [facility] ER if increase Short of breath, chest pain, swelling or other concerns

## 2020-02-22 DIAGNOSIS — I509 Heart failure, unspecified: Secondary | ICD-10-CM | POA: Insufficient documentation

## 2020-02-22 MED ORDER — WARFARIN SODIUM 5 MG PO TABS
5.00 | ORAL_TABLET | ORAL | Status: DC
Start: 2020-02-25 — End: 2020-02-22

## 2020-02-22 MED ORDER — BISACODYL 5 MG PO TBEC
10.00 | DELAYED_RELEASE_TABLET | ORAL | Status: DC
Start: ? — End: 2020-02-22

## 2020-02-22 MED ORDER — GENERIC EXTERNAL MEDICATION
12.50 | Status: DC
Start: 2020-02-25 — End: 2020-02-22

## 2020-02-22 MED ORDER — DSS 100 MG PO CAPS
100.00 | ORAL_CAPSULE | ORAL | Status: DC
Start: ? — End: 2020-02-22

## 2020-02-22 MED ORDER — ALUM & MAG HYDROXIDE-SIMETH 200-200-20 MG/5ML PO SUSP
30.00 | ORAL | Status: DC
Start: ? — End: 2020-02-22

## 2020-02-22 MED ORDER — GENERIC EXTERNAL MEDICATION
7.50 | Status: DC
Start: 2020-02-24 — End: 2020-02-22

## 2020-02-22 MED ORDER — BENZOCAINE-MENTHOL 6-10 MG MT LOZG
1.00 | LOZENGE | OROMUCOSAL | Status: DC
Start: ? — End: 2020-02-22

## 2020-02-22 MED ORDER — DIGOXIN 125 MCG PO TABS
0.13 | ORAL_TABLET | ORAL | Status: DC
Start: 2020-02-25 — End: 2020-02-22

## 2020-02-22 MED ORDER — MELATONIN 3 MG PO TABS
6.00 | ORAL_TABLET | ORAL | Status: DC
Start: ? — End: 2020-02-22

## 2020-02-22 MED ORDER — METOPROLOL SUCCINATE ER 25 MG PO TB24
25.00 | ORAL_TABLET | ORAL | Status: DC
Start: 2020-02-25 — End: 2020-02-22

## 2020-02-22 MED ORDER — ACETAMINOPHEN 325 MG PO TABS
650.00 | ORAL_TABLET | ORAL | Status: DC
Start: ? — End: 2020-02-22

## 2020-02-25 MED ORDER — LISINOPRIL 5 MG PO TABS
5.00 | ORAL_TABLET | ORAL | Status: DC
Start: 2020-02-25 — End: 2020-02-25

## 2020-02-25 MED ORDER — FUROSEMIDE 40 MG PO TABS
80.00 | ORAL_TABLET | ORAL | Status: DC
Start: 2020-02-25 — End: 2020-02-25

## 2020-03-24 ENCOUNTER — Ambulatory Visit: Payer: BC Managed Care – PPO | Admitting: Gastroenterology

## 2020-07-02 ENCOUNTER — Encounter (HOSPITAL_COMMUNITY): Payer: Self-pay | Admitting: Emergency Medicine

## 2020-07-02 ENCOUNTER — Emergency Department (HOSPITAL_COMMUNITY): Payer: BC Managed Care – PPO

## 2020-07-02 ENCOUNTER — Emergency Department (HOSPITAL_COMMUNITY)
Admission: EM | Admit: 2020-07-02 | Discharge: 2020-07-03 | Disposition: A | Discharge status: Home / Self Care | Payer: BC Managed Care – PPO | Attending: Emergency Medicine | Admitting: Emergency Medicine

## 2020-07-02 ENCOUNTER — Other Ambulatory Visit: Payer: Self-pay

## 2020-07-02 ENCOUNTER — Ambulatory Visit
Admission: EM | Admit: 2020-07-02 | Discharge: 2020-07-02 | Disposition: A | Discharge status: Home / Self Care | Payer: BC Managed Care – PPO

## 2020-07-02 DIAGNOSIS — Z20822 Contact with and (suspected) exposure to covid-19: Secondary | ICD-10-CM | POA: Insufficient documentation

## 2020-07-02 DIAGNOSIS — I110 Hypertensive heart disease with heart failure: Secondary | ICD-10-CM | POA: Diagnosis not present

## 2020-07-02 DIAGNOSIS — I509 Heart failure, unspecified: Secondary | ICD-10-CM | POA: Insufficient documentation

## 2020-07-02 DIAGNOSIS — R6883 Chills (without fever): Secondary | ICD-10-CM | POA: Insufficient documentation

## 2020-07-02 DIAGNOSIS — Z79899 Other long term (current) drug therapy: Secondary | ICD-10-CM | POA: Insufficient documentation

## 2020-07-02 DIAGNOSIS — Z950 Presence of cardiac pacemaker: Secondary | ICD-10-CM | POA: Diagnosis not present

## 2020-07-02 LAB — CBC WITH DIFFERENTIAL/PLATELET
Abs Immature Granulocytes: 0.02 10*3/uL (ref 0.00–0.07)
Basophils Absolute: 0 10*3/uL (ref 0.0–0.1)
Basophils Relative: 1 %
Eosinophils Absolute: 0.1 10*3/uL (ref 0.0–0.5)
Eosinophils Relative: 1 %
HCT: 32.7 % — ABNORMAL LOW (ref 36.0–46.0)
Hemoglobin: 11.2 g/dL — ABNORMAL LOW (ref 12.0–15.0)
Immature Granulocytes: 0 %
Lymphocytes Relative: 12 %
Lymphs Abs: 0.8 10*3/uL (ref 0.7–4.0)
MCH: 34.3 pg — ABNORMAL HIGH (ref 26.0–34.0)
MCHC: 34.3 g/dL (ref 30.0–36.0)
MCV: 100 fL (ref 80.0–100.0)
Monocytes Absolute: 0.5 10*3/uL (ref 0.1–1.0)
Monocytes Relative: 7 %
Neutro Abs: 5.3 10*3/uL (ref 1.7–7.7)
Neutrophils Relative %: 79 %
Platelets: 131 10*3/uL — ABNORMAL LOW (ref 150–400)
RBC: 3.27 MIL/uL — ABNORMAL LOW (ref 3.87–5.11)
RDW: 13.4 % (ref 11.5–15.5)
WBC: 6.7 10*3/uL (ref 4.0–10.5)
nRBC: 0 % (ref 0.0–0.2)

## 2020-07-02 LAB — COMPREHENSIVE METABOLIC PANEL
ALT: 24 U/L (ref 0–44)
AST: 24 U/L (ref 15–41)
Albumin: 4.1 g/dL (ref 3.5–5.0)
Alkaline Phosphatase: 45 U/L (ref 38–126)
Anion gap: 8 (ref 5–15)
BUN: 36 mg/dL — ABNORMAL HIGH (ref 6–20)
CO2: 24 mmol/L (ref 22–32)
Calcium: 8.5 mg/dL — ABNORMAL LOW (ref 8.9–10.3)
Chloride: 100 mmol/L (ref 98–111)
Creatinine, Ser: 1.29 mg/dL — ABNORMAL HIGH (ref 0.44–1.00)
GFR calc Af Amer: 54 mL/min — ABNORMAL LOW (ref >60)
GFR calc non Af Amer: 47 mL/min — ABNORMAL LOW (ref >60)
Glucose, Bld: 103 mg/dL — ABNORMAL HIGH (ref 70–99)
Potassium: 5.1 mmol/L (ref 3.5–5.1)
Sodium: 132 mmol/L — ABNORMAL LOW (ref 135–145)
Total Bilirubin: 1.1 mg/dL (ref 0.3–1.2)
Total Protein: 6.9 g/dL (ref 6.5–8.1)

## 2020-07-02 LAB — URINALYSIS, ROUTINE W REFLEX MICROSCOPIC
Bilirubin Urine: NEGATIVE
Glucose, UA: NEGATIVE mg/dL
Hgb urine dipstick: NEGATIVE
Ketones, ur: NEGATIVE mg/dL
Leukocytes,Ua: NEGATIVE
Nitrite: NEGATIVE
Protein, ur: NEGATIVE mg/dL
Specific Gravity, Urine: 1.01 (ref 1.005–1.030)
pH: 6 (ref 5.0–8.0)

## 2020-07-02 LAB — RESPIRATORY PANEL BY RT PCR (FLU A&B, COVID)
Influenza A by PCR: NEGATIVE
Influenza B by PCR: NEGATIVE
SARS Coronavirus 2 by RT PCR: NEGATIVE

## 2020-07-02 LAB — LACTIC ACID, PLASMA
Lactic Acid, Venous: 0.8 mmol/L (ref 0.5–1.9)
Lactic Acid, Venous: 0.8 mmol/L (ref 0.5–1.9)

## 2020-07-02 MED ORDER — SODIUM CHLORIDE 0.9 % IV BOLUS
500.0000 mL | Freq: Once | INTRAVENOUS | Status: AC
  Administered 2020-07-02: 500 mL via INTRAVENOUS

## 2020-07-02 MED ORDER — SODIUM CHLORIDE 0.9 % IV BOLUS
1000.0000 mL | Freq: Once | INTRAVENOUS | Status: AC
  Administered 2020-07-02: 1000 mL via INTRAVENOUS

## 2020-07-02 MED ORDER — SODIUM CHLORIDE 0.9 % IV SOLN
1.0000 g | Freq: Once | INTRAVENOUS | Status: AC
  Administered 2020-07-02: 1 g via INTRAVENOUS
  Filled 2020-07-02: qty 10

## 2020-07-02 MED ORDER — SODIUM CHLORIDE 0.9 % IV SOLN
500.0000 mg | Freq: Once | INTRAVENOUS | Status: AC
  Administered 2020-07-02: 500 mg via INTRAVENOUS
  Filled 2020-07-02: qty 500

## 2020-07-02 NOTE — ED Provider Notes (Signed)
[facility] EMERGENCY DEPARTMENT Provider Note   CSN: [id number] Arrival date & time: 07/02/20  1828     History Chief Complaint  Patient presents with  . Cough  . Chills    Brandi Fischer is a 54 y.o. female.  HPI She reports onset of chills, today.  No other symptoms.  She denies documented fever, cough, dysuria, diarrhea, abdominal or back pain.  She had Covid vaccines, about 6 months ago, [employer], x2.  No recent known sick contacts.  There are no other known modifying factors.    Past Medical History:  Diagnosis Date  . Acute tonsillitis   . Cardiac murmur, unspecified   . CHF (congestive heart failure) ([place])   . Congenital heart defect 1967  . Endocarditis, valve unspecified   . Heart disease   . History of cardioversion   . Hyperlipidemia   . 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 Unspecified chronic conjunctivitis, left eye     Patient Active Problem List   Diagnosis Date Noted  . Edema of both legs 02/20/2020  . Essential hypertension, benign 02/20/2020  . Cough 02/20/2020  . Thrombocytopenia ([place]) 11/28/2019  . Unspecified chronic conjunctivitis, left eye   . Cardiac murmur, unspecified   . Endocarditis, valve unspecified   . CONSTIPATION 12/17/2009  . BLOOD IN STOOL, OCCULT 12/17/2009    Past Surgical History:  Procedure Laterality Date  . ABDOMINAL HYSTERECTOMY    . APPENDECTOMY  10/2010  . cardiac pacemaker procedure    . CARDIAC SURGERY    . TUBAL LIGATION       OB History   No obstetric history on file.     Family History  Problem Relation Age of Onset  . Stroke Father   . Diabetes Maternal Grandfather   . Diabetes Paternal Grandmother   . Breast cancer Paternal Grandmother   . Breast cancer Paternal Aunt     Social History   Tobacco Use  . Smoking status: Never Smoker  . Smokeless tobacco: Never Used  Vaping Use  . Vaping Use: Never used  Substance Use Topics  . Alcohol use: No  . Drug use: No    Home Medications Prior to Admission  medications   Medication Sig Start Date End Date Taking? Authorizing Provider  calcium-vitamin D (OSCAL WITH D) 500-200 MG-UNIT per tablet Take 1 tablet by mouth.   Yes [provider]  digoxin (LANOXIN) 0.125 MG tablet Take by mouth. 02/24/20  Yes [provider]  fexofenadine (ALLEGRA) 180 MG tablet Take 180 mg by mouth daily.   Yes [provider]  furosemide (LASIX) 80 MG tablet Take 40 mg by mouth daily.  02/24/20  Yes [provider]  lisinopril (ZESTRIL) 5 MG tablet Take 5 mg by mouth daily. 04/18/20  Yes [provider]  Magnesium (V-R MAGNESIUM) 250 MG TABS Take 250 mg by mouth daily.    Yes [provider]  metoprolol succinate (TOPROL-XL) 25 MG 24 hr tablet Take 1 tablet (25 mg total) by mouth daily. 02/11/20 08/09/20 Yes Corum, Rex Kras, MD  Multiple Vitamin (MULTIVITAMIN PO) Take 1 tablet by mouth daily.     Yes [provider]  potassium chloride (MICRO-K) 10 MEQ CR capsule Take 10 mEq by mouth daily. 06/16/20  Yes [provider]  spironolactone (ALDACTONE) 25 MG tablet Take by mouth. 02/24/20  Yes [provider]  warfarin (COUMADIN) 5 MG tablet Take 5 mg by mouth See admin instructions. Take 2.5 tablet by mouth  every day except wed and sundays 5 mg 01/22/20  Yes [provider]    Allergies    Shellfish allergy  Review of Systems   Review of Systems  All other systems reviewed and are negative.   Physical Exam Updated Vital Signs BP (!) 89/55 (BP Location: Left Arm) Comment: DR. Eulis Foster aware  Pulse 77   Temp 99.7 F (37.6 C) (Oral)   Resp (!) 22   Ht 5\' 2"  (1.575 m)   Wt 64.9 kg   SpO2 90%   BMI 26.16 kg/m   Physical Exam Vitals and nursing note reviewed.  Constitutional:      Appearance: She is well-developed.  HENT:     Head: Normocephalic and atraumatic.     Right Ear: External ear normal.     Left Ear: External ear normal.  Eyes:     Conjunctiva/sclera: Conjunctivae normal.      Pupils: Pupils are equal, round, and reactive to light.  Neck:     Trachea: Phonation normal.  Cardiovascular:     Rate and Rhythm: Normal rate and regular rhythm.     Heart sounds: Normal heart sounds.  Pulmonary:     Effort: Pulmonary effort is normal.     Breath sounds: Normal breath sounds.  Abdominal:     Palpations: Abdomen is soft.     Tenderness: There is no abdominal tenderness.  Musculoskeletal:        General: Normal range of motion.     Cervical back: Normal range of motion and neck supple.  Skin:    General: Skin is warm.     Comments: Warm moist skin  Neurological:     Mental Status: She is alert and oriented to person, place, and time.     Cranial Nerves: No cranial nerve deficit.     Sensory: No sensory deficit.     Motor: No abnormal muscle tone.     Coordination: Coordination normal.  Psychiatric:        Mood and Affect: Mood normal.        Behavior: Behavior normal.        Thought Content: Thought content normal.        Judgment: Judgment normal.     ED Results / Procedures / Treatments   Labs (all labs ordered are listed, but only abnormal results are displayed) Labs Reviewed  COMPREHENSIVE METABOLIC PANEL - Abnormal; Notable for the following components:      Result Value   Sodium 132 (*)    Glucose, Bld 103 (*)    BUN 36 (*)    Creatinine, Ser 1.29 (*)    Calcium 8.5 (*)    GFR calc non Af Amer 47 (*)    GFR calc Af Amer 54 (*)    All other components within normal limits  CBC WITH DIFFERENTIAL/PLATELET - Abnormal; Notable for the following components:   RBC 3.27 (*)    Hemoglobin 11.2 (*)    HCT 32.7 (*)    MCH 34.3 (*)    Platelets 131 (*)    All other components within normal limits  RESPIRATORY PANEL BY RT PCR (FLU A&B, COVID)  CULTURE, BLOOD (ROUTINE X 2)  CULTURE, BLOOD (ROUTINE X 2)  URINALYSIS, ROUTINE W REFLEX MICROSCOPIC  LACTIC ACID, PLASMA  LACTIC ACID, PLASMA    EKG None  Radiology DG Chest Port 1 View  Result  Date: 07/02/2020 CLINICAL DATA:  Nonproductive cough and fever. EXAM: PORTABLE CHEST 1 VIEW COMPARISON:  October 11, 2010 FINDINGS: Multiple sternal wires are seen. A multi lead AICD is in place with stable lead wire positioning. The lungs are hyperinflated. There is no evidence of acute infiltrate, pleural effusion or pneumothorax. There is stable mild to moderate severity enlargement of the cardiac silhouette. The visualized skeletal structures are unremarkable. IMPRESSION: 1. Evidence of prior median sternotomy/CABG. 2. No acute or active cardiopulmonary disease. Electronically Signed   By: Virgina Norfolk M.D.   On: 07/02/2020 20:06    Procedures Procedures (including critical care time)  Medications Ordered in ED Medications  sodium chloride 0.9 % bolus 1,000 mL (0 mLs Intravenous Stopped 07/02/20 2018)  cefTRIAXone (ROCEPHIN) 1 g in sodium chloride 0.9 % 100 mL IVPB (0 g Intravenous Stopped 07/02/20 2018)  azithromycin (ZITHROMAX) 500 mg in sodium chloride 0.9 % 250 mL IVPB (0 mg Intravenous Stopped 07/02/20 2124)  sodium chloride 0.9 % bolus 500 mL (0 mLs Intravenous Stopped 07/02/20 2232)    ED Course  I have reviewed the triage vital signs and the nursing notes.  Pertinent labs & imaging results that were available during my care of the patient were reviewed by me and considered in my medical decision making (see chart for details).  Clinical Course as of Jul 02 2344  Wed Jul 02, 2020  2216 Unchanged, normal delta troponin  Lactic acid, plasma [EW]  2217 Normal except hemoglobin low, platelets low  CBC with Differential(!) [EW]  2217 Normal except sodium low, BUN high, creatinine high, calcium low, GFR low  Comprehensive metabolic panel(!) [EW]  8099 CBC with Differential(!) [EW]  2217 Normal  Respiratory Panel by RT PCR (Flu A&B, Covid) - Nasopharyngeal Swab [EW]  2217 Normal  Urinalysis, Routine w reflex microscopic Urine, Clean Catch [EW]    Clinical Course User Index [EW]  Daleen Bo, MD   MDM Rules/Calculators/A&P                           Patient Vitals for the past 24 hrs:  BP Temp Temp src Pulse Resp SpO2 Height Weight  07/02/20 2341 (!) 89/55 -- -- 77 -- -- -- --  07/02/20 2340 (!) 120/97 -- -- 78 -- -- -- --  07/02/20 2339 (!) 101/57 -- -- 76 -- -- -- --  07/02/20 2200 (!) 107/52 -- -- 74 (!) 22 90 % -- --  07/02/20 2030 (!) 90/50 -- -- 77 18 (!) 89 % -- --  07/02/20 1935 (!) 101/42 99.7 F (37.6 C) Oral 75 20 92 % -- --  07/02/20 1830 (!) 121/50 99.2 F (37.3 C) Oral 78 (!) 26 92 % 5\' 2"  (1.575 m) 64.9 kg     Reevaluation with update and discussion. After initial assessment and treatment, an updated evaluation reveals at discharge, comfortable without further complaints.  Ambulatory no apparent distress. Daleen Bo   Medical Decision Making:  This patient is presenting for evaluation of chills, which does require a range of treatment options, and is a complaint that involves a high risk of morbidity and mortality. The differential diagnoses include viral illness, bacterial illness, sepsis. I decided to review old records, and in summary immunocompetent patient presenting with chills, hypoxia, and low blood pressure.  I do not require additional historical information from anyone.  Clinical Laboratory Tests Ordered, included CBC, Metabolic panel, Urinalysis and Lactate. Review indicates laboratory tests are reassuring, with hemoglobin low, sodium low, glucose slightly elevated, BUN/creatinine slightly elevated.  Covid and flu testing negative. Radiologic Tests  Ordered, included chest x-ray no acute pneumonia or edema.  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  Patient is asymptomatic discharge without evidence for acute bacterial or viral infection.  Brandi Fischer was evaluated in Emergency Department on 07/03/2020 for the symptoms described in the history of present illness. She was evaluated in the context of the global COVID-19 pandemic, which necessitated consideration that the patient might be at risk for infection with the SARS-CoV-2 virus that causes COVID-19. Institutional protocols and algorithms that pertain to the evaluation of patients at risk for COVID-19 are in a state of rapid change based on information released by regulatory bodies including the CDC and federal and state organizations. These policies and algorithms were followed during the patient's care in the ED.   CRITICAL CARE-no Performed by: Daleen Bo  Nursing Notes Reviewed/ Care Coordinated Applicable Imaging Reviewed Interpretation of Laboratory Data incorporated into ED treatment  The patient appears reasonably screened and/or stabilized for discharge and I doubt any other medical condition or other [facility] requiring further screening, evaluation, or treatment in the ED at this time prior to discharge.  Plan: Home Medications-continue usual; Home Treatments-gradual advance diet and activity; return here if the recommended treatment, does not improve the symptoms; Recommended follow up-cardiology follow-up 1 week and PCP as needed      Final Clinical Impression(s) / ED Diagnoses Final diagnoses:  Chills    Rx / DC Orders ED Discharge Orders    None       Daleen Bo, MD 07/03/20 1352

## 2020-07-02 NOTE — ED Triage Notes (Signed)
Pt c/o of cough and chills since this afternoon

## 2020-07-02 NOTE — ED Triage Notes (Signed)
Patient is being discharged from the Urgent Care and sent to the Emergency Department via ems  . Per K Avegno, patient is in need of higher level of care due to hypoxia and hypotension . Patient is aware and verbalizes understanding of plan of care. Pt placed on 4 L 02 AND 22 gauge started in left hand  Vitals:   07/02/20 1732  BP: (!) 84/55  Pulse: 83  Resp: 18  Temp: 99.6 F (37.6 C)  SpO2: 93%

## 2020-07-02 NOTE — Discharge Instructions (Addendum)
See your cardiologist for a checkup, and to discuss the visit today.  Ask her about your oxygen requirements, and possible referral for pulmonary testing, if needed.

## 2020-07-04 DIAGNOSIS — Z8774 Personal history of (corrected) congenital malformations of heart and circulatory system: Secondary | ICD-10-CM | POA: Insufficient documentation

## 2020-07-07 LAB — CULTURE, BLOOD (ROUTINE X 2)
Culture: NO GROWTH
Culture: NO GROWTH
Special Requests: ADEQUATE

## 2020-07-10 ENCOUNTER — Other Ambulatory Visit: Payer: Self-pay | Admitting: Family Medicine

## 2020-07-10 DIAGNOSIS — I10 Essential (primary) hypertension: Secondary | ICD-10-CM

## 2020-07-10 DIAGNOSIS — R011 Cardiac murmur, unspecified: Secondary | ICD-10-CM

## 2020-07-22 ENCOUNTER — Other Ambulatory Visit: Payer: Self-pay

## 2020-07-22 ENCOUNTER — Ambulatory Visit (INDEPENDENT_AMBULATORY_CARE_PROVIDER_SITE_OTHER): Payer: BC Managed Care – PPO | Admitting: Internal Medicine

## 2020-07-22 ENCOUNTER — Encounter: Payer: Self-pay | Admitting: Internal Medicine

## 2020-07-22 VITALS — BP 124/62 | HR 86 | Temp 97.8°F | Resp 18 | Ht 62.0 in | Wt 145.4 lb

## 2020-07-22 DIAGNOSIS — Z950 Presence of cardiac pacemaker: Secondary | ICD-10-CM

## 2020-07-22 DIAGNOSIS — I484 Atypical atrial flutter: Secondary | ICD-10-CM

## 2020-07-22 DIAGNOSIS — Q203 Discordant ventriculoarterial connection: Secondary | ICD-10-CM

## 2020-07-22 DIAGNOSIS — I10 Essential (primary) hypertension: Secondary | ICD-10-CM | POA: Diagnosis not present

## 2020-07-22 DIAGNOSIS — Z7689 Persons encountering health services in other specified circumstances: Secondary | ICD-10-CM | POA: Diagnosis not present

## 2020-07-22 DIAGNOSIS — Z8774 Personal history of (corrected) congenital malformations of heart and circulatory system: Secondary | ICD-10-CM

## 2020-07-22 DIAGNOSIS — S161XXA Strain of muscle, fascia and tendon at neck level, initial encounter: Secondary | ICD-10-CM | POA: Insufficient documentation

## 2020-07-22 NOTE — Assessment & Plan Note (Signed)
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 

## 2020-07-22 NOTE — Assessment & Plan Note (Signed)
Advised to continue simple neck exercises Apply heating pad Avoid prolonged bending of the neck (use of electronic devices)

## 2020-07-22 NOTE — Assessment & Plan Note (Signed)
S/p Cardioversion in 02/2020 On Metoprolol, Digoxin and Coumadin 

## 2020-07-22 NOTE — Assessment & Plan Note (Signed)
Care established Previous chart reviewed History and medications reviewed with the patient 

## 2020-07-22 NOTE — Patient Instructions (Addendum)
Please continue to follow up with your Cardiology physicians as scheduled.  Please schedule an appointment with GI for screening colonoscopy after recovery from your cardiac surgery.  Please continue to take medications as prescribed by your Cardiologists.  Please continue to follow heart healthy diet and perform exercise/walking for at least 30 mins/week.

## 2020-07-22 NOTE — Assessment & Plan Note (Signed)
Since 1979, last changed in 2014 Follows EP Cardiology at [facility] On Coumadin

## 2020-07-22 NOTE — Assessment & Plan Note (Signed)
BP: 124/62  Well-controlled with Metoprolol Advised low-salt diet and exercise/walking for at least 150 mins/week

## 2020-07-22 NOTE — Progress Notes (Signed)
New Patient Office Visit  Subjective:  Patient ID: Brandi Fischer, female    DOB: Apr 07, 1966  Age: 54 y.o. MRN: [id number]  CC:  Chief Complaint  Patient presents with  . 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 Patient states that she has been in stable health and follows up with Cardiology team at [facility]. She is scheduled to have valvuloplasty in 08/2020. Patient currently denies chest pain, dyspnea, palpitations, LE edema. Patient is currently taking Metoprolol and Digoxin as rate-controlling agent and Warfarin for AC. She has a home monitor for checking INR and Warfarin dosing is managed by Pharmacy and Cardiology team at [facility]. Patient denies hematuria, melena or hematochezia. Patient is able to work without any discomfort.  Patient mentions neck pain radiating to the shoulders sometimes. She tries some exercises and are usually better with them. She attributes the neck pain to using the phone, tablet and constant laptop use.  Patient had last colonoscopy in 2011, and is going to schedule appointment with GI after the valvuloplasty procedure.  Patient gets Mammography and PAP smear done with Ob./Gyn. Last Mammography and PAP smear in 12/2019.  Patient has had both doses of COVID vaccination [employer] - 01/2020) and flu vaccine.   Past Medical History:  Diagnosis Date  . Acute tonsillitis   . Cardiac murmur, unspecified   . CHF (congestive heart failure) ([place])   . Congenital heart defect 1967  . Endocarditis, valve unspecified   . Heart disease   . History of cardioversion   . Hyperlipidemia   . 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 Unspecified chronic conjunctivitis, left eye      Past Surgical History:  Procedure Laterality Date  . ABDOMINAL HYSTERECTOMY    . APPENDECTOMY  10/2010  . cardiac pacemaker procedure    . CARDIAC SURGERY    . TUBAL LIGATION      Family History  Problem Relation Age of Onset  . Stroke Father   . Diabetes Maternal Grandfather   . Diabetes Paternal Grandmother   . Breast cancer Paternal Grandmother   . Breast cancer Paternal Aunt     Social History   Socioeconomic History  . Marital status: Married    Spouse name: Not on file  . Number of children: Not on file  . Years of education: Not on file  . Highest education level: Not on file  Occupational History  . Not on file  Tobacco Use  . Smoking status: Never Smoker  . Smokeless tobacco: Never Used  Vaping Use  . Vaping Use: Never used  Substance and Sexual Activity  . Alcohol use: No  . Drug use: No  . Sexual activity: Not on file  Other Topics Concern  . Not on file  Social History Narrative  . Not on file   Social Determinants of Health   Financial Resource Strain:   . Difficulty of Paying Living Expenses: Not on file  Food Insecurity:   . Worried About Charity fundraiser in the Last Year: Not on file  . Ran Out of Food in the Last Year: Not on file  Transportation Needs:   . Lack of Transportation (Medical): Not on  file  . Lack of Transportation (Non-Medical): Not on file  Physical Activity:   . Days of Exercise per Week: Not on file  . Minutes of Exercise per Session: Not on file  Stress:   . Feeling of Stress : Not on file  Social Connections:   . Frequency of Communication with Friends and Family: Not on file  . Frequency of Social Gatherings with Friends and Family: Not on file  . Attends Religious Services: Not on file  . Active Member of Clubs or Organizations: Not on file  . Attends Archivist Meetings: Not on file  . Marital Status: Not on file  Intimate Partner Violence:   . Fear of Current or Ex-Partner: Not on file  .  Emotionally Abused: Not on file  . Physically Abused: Not on file  . Sexually Abused: Not on file    ROS Review of Systems  Constitutional: Negative for chills and fever.  HENT: Negative for congestion, sinus pressure, sinus pain and sore throat.   Eyes: Negative for pain and discharge.  Respiratory: Negative for cough and shortness of breath.   Cardiovascular: Negative for chest pain, palpitations and leg swelling.  Gastrointestinal: Negative for abdominal pain, constipation, diarrhea, nausea and vomiting.  Endocrine: Negative for polydipsia and polyuria.  Genitourinary: Negative for dysuria and hematuria.  Musculoskeletal: Negative for neck pain and neck stiffness.  Skin: Negative for rash.  Neurological: Negative for dizziness, syncope, weakness and numbness.  Psychiatric/Behavioral: Negative for agitation and behavioral problems.    Objective:   Today's Vitals: BP 124/62 (BP Location: Right Arm, Patient Position: Sitting, Cuff Size: Normal)   Pulse 86   Temp 97.8 F (36.6 C) (Temporal)   Resp 18   Ht 5\' 2"  (1.575 m)   Wt 145 lb 6.4 oz (66 kg)   SpO2 92%   BMI 26.59 kg/m   Physical Exam Vitals reviewed.  Constitutional:      General: She is not in acute distress.    Appearance: She is not diaphoretic.  HENT:     Head: Normocephalic and atraumatic.     Nose: Nose normal.     Mouth/Throat:     Mouth: Mucous membranes are moist.  Eyes:     General: No scleral icterus.    Extraocular Movements: Extraocular movements intact.     Pupils: Pupils are equal, round, and reactive to light.  Cardiovascular:     Rate and Rhythm: Normal rate and regular rhythm.     Pulses: Normal pulses.     Heart sounds: Murmur (Systolic (pronounced in upper sternal border)) heard.   Pulmonary:     Breath sounds: Normal breath sounds. No wheezing or rales.  Abdominal:     Palpations: Abdomen is soft.     Tenderness: There is no abdominal tenderness.  Musculoskeletal:     Cervical  back: Neck supple. No rigidity or tenderness.     Right lower leg: No edema.     Left lower leg: No edema.  Skin:    General: Skin is warm.     Findings: No rash.  Neurological:     General: No focal deficit present.     Mental Status: She is alert and oriented to person, place, and time.     Sensory: No sensory deficit.     Motor: No weakness.  Psychiatric:        Mood and Affect: Mood normal.        Behavior: Behavior normal.     Assessment &  Plan:   Problem List Items Addressed This Visit      Encounter to establish care Care established Previous chart reviewed History and medications reviewed with the patient  H/O Mustard procedure H/o transposition of great aretries s/p Mustard surgery (1969) Follows up with Pediatric Cardiology and Cardiologist at [place] pacemaker in situ Since 1979, last changed in 2014 Colon Cardiology at [facility] On Coumadin  Essential hypertension, benign BP: 124/62  Well-controlled with Metoprolol Advised low-salt diet and exercise/walking for at least 150 mins/week  Atypical atrial flutter ([place]) S/p Cardioversion in 02/2020 On Metoprolol, Digoxin and Coumadin  Neck muscle strain Advised to continue simple neck exercises Apply heating pad Avoid prolonged bending of the neck (use of electronic devices)  Need for screening colonoscopy Patient to schedule appointment with GI after valvuloplasty procedure.  Outpatient Encounter Medications as of 07/22/2020  Medication Sig  . calcium-vitamin D (OSCAL WITH D) 500-200 MG-UNIT per tablet Take 1 tablet by mouth.  . digoxin (LANOXIN) 0.125 MG tablet Take by mouth.  . furosemide (LASIX) 80 MG tablet Take 40 mg by mouth daily.   . Magnesium (V-R MAGNESIUM) 250 MG TABS Take 250 mg by mouth daily.   . metoprolol succinate (TOPROL-XL) 25 MG 24 hr tablet Take 1 tablet (25 mg total) by mouth daily.  . Multiple Vitamin (MULTIVITAMIN PO) Take 1 tablet by mouth daily.    .  potassium chloride (MICRO-K) 10 MEQ CR capsule Take 10 mEq by mouth daily.  Marland Kitchen spironolactone (ALDACTONE) 25 MG tablet Take by mouth.  . warfarin (COUMADIN) 5 MG tablet Take 5 mg by mouth See admin instructions. Take 2.5 tablet by mouth every day except wed and sundays 5 mg  . [DISCONTINUED] fexofenadine (ALLEGRA) 180 MG tablet Take 180 mg by mouth daily.  . [DISCONTINUED] lisinopril (ZESTRIL) 5 MG tablet Take 5 mg by mouth daily.   No facility-administered encounter medications on file as of 07/22/2020.    Follow-up: Return in about 3 months (around 10/22/2020).   Lindell Spar, MD

## 2020-08-16 ENCOUNTER — Other Ambulatory Visit: Payer: Self-pay | Admitting: Internal Medicine

## 2020-08-16 DIAGNOSIS — I10 Essential (primary) hypertension: Secondary | ICD-10-CM

## 2020-08-16 DIAGNOSIS — R011 Cardiac murmur, unspecified: Secondary | ICD-10-CM

## 2020-08-20 ENCOUNTER — Telehealth: Payer: Self-pay | Admitting: *Deleted

## 2020-08-20 NOTE — Telephone Encounter (Signed)
Pt is requesting potassium to be refilled however this is not on active med list please advise if you would like to refill or you would like to see pt before filling

## 2020-08-20 NOTE — Telephone Encounter (Signed)
Please advise her that her potassium was in higher range when she had last blood tests on 09/29. So she should not take Potassium. We will decide based on next blood tests whether she actually needs it.

## 2020-08-20 NOTE — Telephone Encounter (Signed)
Pt notified with verbal understanding  °

## 2020-10-05 ENCOUNTER — Encounter: Payer: Self-pay | Admitting: Emergency Medicine

## 2020-10-05 ENCOUNTER — Other Ambulatory Visit: Payer: Self-pay

## 2020-10-05 ENCOUNTER — Ambulatory Visit
Admission: EM | Admit: 2020-10-05 | Discharge: 2020-10-05 | Disposition: A | Discharge status: Home / Self Care | Payer: 59 | Attending: Family Medicine | Admitting: Family Medicine

## 2020-10-05 DIAGNOSIS — J069 Acute upper respiratory infection, unspecified: Secondary | ICD-10-CM | POA: Diagnosis not present

## 2020-10-05 DIAGNOSIS — Z7689 Persons encountering health services in other specified circumstances: Secondary | ICD-10-CM | POA: Diagnosis not present

## 2020-10-05 NOTE — ED Provider Notes (Signed)
[facility]    CSN: [id number] Arrival date & time: 10/05/20  1053      History   Chief Complaint Chief Complaint  Patient presents with  . Nasal Congestion    HPI Brandi Fischer is a 55 y.o. female.   HPI   Patient's had cold symptoms for 5 days.  They are gradually getting worse.  She is having coughing and some congestion.  She worries because of her heart condition.  She has chronic congestive heart failure.  She has not been tested for Covid.  She has been vaccinated.  She states she has had no Covid exposure.  No fever chills, headaches, or body aches.  She does have significant fatigue  Past Medical History:  Diagnosis Date  . Acute tonsillitis   . Cardiac murmur, unspecified   . CHF (congestive heart failure) ([place])   . Congenital heart defect 1967  . Endocarditis, valve unspecified   . Heart disease   . History of cardioversion   . Hyperlipidemia   . 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 Unspecified chronic conjunctivitis, left eye     Patient Active Problem List   Diagnosis Date Noted  . Encounter to establish care 07/22/2020  . Neck muscle strain 07/22/2020  . H/O Mustard procedure 07/04/2020  . Acute heart failure ([place]) 02/22/2020  . Edema of both legs 02/20/2020  . Essential hypertension, benign 02/20/2020  . Cough 02/20/2020  . Thrombocytopenia ([place]) 11/28/2019  . Cardiac murmur, unspecified   . Endocarditis, valve unspecified   . Atypical atrial flutter ([place]) 05/01/2018  . Cardiac pacemaker in situ 01/15/2013  . Transposition of great vessels 01/17/2012  . BLOOD IN STOOL, OCCULT 12/17/2009    Past Surgical History:  Procedure Laterality Date  . ABDOMINAL HYSTERECTOMY    . APPENDECTOMY  10/2010  . cardiac pacemaker procedure    . CARDIAC SURGERY    . TUBAL LIGATION      OB History   No obstetric history on file.      Home Medications    Prior to Admission medications   Medication Sig Start Date End Date Taking? Authorizing Provider   calcium-vitamin D (OSCAL WITH D) 500-200 MG-UNIT per tablet Take 1 tablet by mouth.   Yes [provider]  digoxin (LANOXIN) 0.125 MG tablet TAKE 1 TABLET(0.125 MG) BY MOUTH DAILY 08/17/20  Yes Lindell Spar, MD  furosemide (LASIX) 80 MG tablet Take 40 mg by mouth daily.  02/24/20  Yes [provider]  Magnesium (V-R MAGNESIUM) 250 MG TABS Take 250 mg by mouth daily.    Yes [provider]  metoprolol succinate (TOPROL-XL) 25 MG 24 hr tablet TAKE 1 TABLET(25 MG) BY MOUTH DAILY 08/17/20  Yes Lindell Spar, MD  Multiple Vitamin (MULTIVITAMIN PO) Take 1 tablet by mouth daily.   Yes [provider]  spironolactone (ALDACTONE) 25 MG tablet TAKE 1/2 TABLET(12.5 MG) BY MOUTH DAILY 08/17/20  Yes Lindell Spar, MD  warfarin (COUMADIN) 5 MG tablet Take 3.5 mg by mouth See admin instructions. Take 3.5 mg by mouth every day except wed and sundays 5 mg 01/22/20   [provider]    Family History Family History  Problem Relation Age of Onset  . Stroke Father   . Diabetes Maternal Grandfather   . Diabetes Paternal Grandmother   . Breast cancer Paternal Grandmother   . Breast cancer Paternal Aunt     Social History Social History   Tobacco Use  .  Smoking status: Never Smoker  . Smokeless tobacco: Never Used  Vaping Use  . Vaping Use: Never used  Substance Use Topics  . Alcohol use: No  . Drug use: No     Allergies   Shellfish allergy   Review of Systems Review of Systems  See HPI Physical Exam Triage Vital Signs ED Triage Vitals  Enc Vitals Group     BP 10/05/20 1118 118/72     Pulse Rate 10/05/20 1118 81     Resp 10/05/20 1118 15     Temp 10/05/20 1118 98.1 F (36.7 C)     Temp src --      SpO2 10/05/20 1118 95 %     Weight 10/05/20 1134 144 lb (65.3 kg)     Height 10/05/20 1134 5\' 2"  (1.575 m)     Head Circumference --      Peak Flow --      Pain Score 10/05/20 1134 0     Pain Loc --      Pain Edu? --      Excl. in GC?  --    No data found.  Updated Vital Signs BP 118/72   Pulse 81   Temp 98.1 F (36.7 C)   Resp 15   Ht 5\' 2"  (1.575 m)   Wt 65.3 kg   SpO2 95%   BMI 26.34 kg/m      Physical Exam Constitutional:      General: She is not in acute distress.    Appearance: She is well-developed, normal weight and well-nourished.  HENT:     Head: Normocephalic and atraumatic.     Right Ear: Tympanic membrane and ear canal normal.     Left Ear: Tympanic membrane and ear canal normal.     Nose: Congestion present.     Mouth/Throat:     Mouth: Oropharynx is clear and moist.     Pharynx: No posterior oropharyngeal erythema.  Eyes:     Conjunctiva/sclera: Conjunctivae normal.     Pupils: Pupils are equal, round, and reactive to light.  Cardiovascular:     Rate and Rhythm: Normal rate.     Heart sounds: Murmur heard.      Comments: Prominent systolic murmur, harsh, can be heard through to the back Pulmonary:     Effort: Pulmonary effort is normal. No respiratory distress.     Breath sounds: Normal breath sounds. No wheezing, rhonchi or rales.  Abdominal:     General: There is no distension.     Palpations: Abdomen is soft.  Musculoskeletal:        General: No edema. Normal range of motion.     Cervical back: Normal range of motion.  Skin:    General: Skin is warm and dry.  Neurological:     Mental Status: She is alert.  Psychiatric:        Behavior: Behavior normal.      UC Treatments / Results  Labs (all labs ordered are listed, but only abnormal results are displayed) Labs Reviewed  NOVEL CORONAVIRUS, NAA    EKG   Radiology No results found.  Procedures Procedures (including critical care time)  Medications Ordered in UC Medications - No data to display  Initial Impression / Assessment and Plan / UC Course  I have reviewed the triage vital signs and the nursing notes.  Pertinent labs & imaging results that were available during my care of the patient were reviewed  by me and considered in my  medical decision making (see chart for details).      Final Clinical Impressions(s) / UC Diagnoses   Final diagnoses:  Viral upper respiratory illness     Discharge Instructions     Go home to rest Drink plenty of fluids Take Tylenol for pain or fever You may take over-the-counter cough and cold medicines as needed, mucinex DM, flonase, coricidin HBP are safe You must quarantine at home until your test result is available You can check for your test result in MyChart    ED Prescriptions    None     PDMP not reviewed this encounter.   Raylene Everts, MD 10/05/20 [id number]

## 2020-10-05 NOTE — ED Triage Notes (Addendum)
Patient states that she has runny nose, congestion, cough, sneezing x 3 days. denies any fever

## 2020-10-05 NOTE — Discharge Instructions (Signed)
Go home to rest Drink plenty of fluids Take Tylenol for pain or fever You may take over-the-counter cough and cold medicines as needed, mucinex DM, flonase, coricidin HBP are safe You must quarantine at home until your test result is available You can check for your test result in MyChart

## 2020-10-07 LAB — SARS-COV-2, NAA 2 DAY TAT

## 2020-10-07 LAB — NOVEL CORONAVIRUS, NAA: SARS-CoV-2, NAA: NOT DETECTED

## 2020-10-08 DIAGNOSIS — I480 Paroxysmal atrial fibrillation: Secondary | ICD-10-CM | POA: Diagnosis not present

## 2020-10-08 DIAGNOSIS — Z7901 Long term (current) use of anticoagulants: Secondary | ICD-10-CM | POA: Diagnosis not present

## 2020-10-16 DIAGNOSIS — Z8774 Personal history of (corrected) congenital malformations of heart and circulatory system: Secondary | ICD-10-CM | POA: Diagnosis not present

## 2020-10-22 ENCOUNTER — Encounter: Payer: Self-pay | Admitting: Internal Medicine

## 2020-10-22 ENCOUNTER — Other Ambulatory Visit: Payer: Self-pay

## 2020-10-22 ENCOUNTER — Ambulatory Visit: Payer: 59 | Admitting: Internal Medicine

## 2020-10-22 VITALS — BP 97/63 | HR 86 | Temp 97.7°F | Resp 16 | Ht 62.0 in | Wt 145.0 lb

## 2020-10-22 DIAGNOSIS — Z8774 Personal history of (corrected) congenital malformations of heart and circulatory system: Secondary | ICD-10-CM

## 2020-10-22 DIAGNOSIS — I371 Nonrheumatic pulmonary valve insufficiency: Secondary | ICD-10-CM | POA: Diagnosis not present

## 2020-10-22 DIAGNOSIS — Z23 Encounter for immunization: Secondary | ICD-10-CM

## 2020-10-22 DIAGNOSIS — I484 Atypical atrial flutter: Secondary | ICD-10-CM | POA: Diagnosis not present

## 2020-10-22 DIAGNOSIS — I10 Essential (primary) hypertension: Secondary | ICD-10-CM | POA: Diagnosis not present

## 2020-10-22 NOTE — Assessment & Plan Note (Signed)
BP Readings from Last 1 Encounters:  10/22/20 97/63   Well-controlled with Metoprolol and Spironolactone Counseled for compliance with the medications Advised DASH diet and moderate exercise/walking, at least 150 mins/week

## 2020-10-22 NOTE — Addendum Note (Signed)
Addended by: Lonn Georgia on: 10/22/2020 09:22 AM   Modules accepted: Orders

## 2020-10-22 NOTE — Assessment & Plan Note (Signed)
S/p Cardioversion in 02/2020 On Metoprolol, Digoxin and Coumadin 

## 2020-10-22 NOTE — Assessment & Plan Note (Signed)
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 

## 2020-10-22 NOTE — Assessment & Plan Note (Signed)
Moderate, with elevated pulmonary arterial pressure and LVEDP Currently euvolemic and asymptomatic Follows up with Cardiology

## 2020-10-22 NOTE — Patient Instructions (Signed)
Please get fasting blood tests done before the next visit.  Please continue to take medications as advised.  You were given first dose of Shingrix vaccine and PCV13 - Pneumonia vaccine in the office today.

## 2020-10-22 NOTE — Progress Notes (Signed)
Established Patient Office Visit  Subjective:  Patient ID: Brandi Fischer, female    DOB: [birth year]  Age: 55 y.o. MRN: [id number]  CC:  Chief Complaint  Patient presents with  . Follow-up  . 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  She has been taking her Metoprolol and Spironolactone for her HTN and h/o A flutter and CHF. She used to take Lisinopril, but it was discontinued by her Cardiologist as she was having hypotension. BP is well-controlled now. Takes medications regularly. Patient denies headache, dizziness, chest pain, dyspnea or palpitations.  She had cardiac cath in 08/2020, which showed findings as below:  Findings:  1. Systemic Pulmonary Ventricular pressure (LV) with elevated EDP 2. Elevated Systemic Ventricular EDP and LA pressure 3. Baffle leak (mostly right to left shunt) 4. Severely dilated main pulmonary artery   Patient is to schedule her appointment with GI for screening colonoscopy.    Past Medical History:  Diagnosis Date  . Acute tonsillitis   . Cardiac murmur, unspecified   . CHF (congestive heart failure) ([place])   . 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 Endocarditis, valve unspecified   . Heart disease   . History of cardioversion   . Hyperlipidemia   . 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 Unspecified chronic conjunctivitis, left eye     Past Surgical History:  Procedure Laterality Date  . ABDOMINAL HYSTERECTOMY    . APPENDECTOMY  10/2010  . cardiac pacemaker procedure    . CARDIAC SURGERY    . TUBAL LIGATION      Family History  Problem Relation Age of Onset  . Stroke Father   . Diabetes Maternal Grandfather   . Diabetes Paternal Grandmother   . Breast cancer Paternal Grandmother   .  Breast cancer Paternal Aunt     Social History   Socioeconomic History  . Marital status: Married    Spouse name: Not on file  . Number of children: Not on file  . Years of education: Not on file  . Highest education level: Not on file  Occupational History  . Not on file  Tobacco Use  . Smoking status: Never Smoker  . Smokeless tobacco: Never Used  Vaping Use  . Vaping Use: Never used  Substance and Sexual Activity  . Alcohol use: No  . Drug use: No  . Sexual activity: Not on file  Other Topics Concern  . Not on file  Social History Narrative  . Not on file   Social Determinants of Health   Financial Resource Strain: Not on file  Food Insecurity: Not on file  Transportation Needs: Not on file  Physical Activity: Not on file  Stress: Not on file  Social Connections: Not on file  Intimate Partner Violence: Not on file    Outpatient Medications Prior to Visit  Medication Sig Dispense Refill  . calcium-vitamin D (OSCAL WITH D) 500-200 MG-UNIT per tablet Take 1 tablet by mouth.    . digoxin (LANOXIN) 0.125 MG tablet TAKE 1 TABLET(0.125 MG) BY MOUTH DAILY 30 tablet 3  . furosemide (LASIX) 80 MG tablet Take 40 mg by mouth daily.     . Magnesium (V-R MAGNESIUM) 250 MG TABS Take 250 mg by mouth daily.     Marland Kitchen  metoprolol succinate (TOPROL-XL) 25 MG 24 hr tablet TAKE 1 TABLET(25 MG) BY MOUTH DAILY 90 tablet 1  . Multiple Vitamin (MULTIVITAMIN PO) Take 1 tablet by mouth daily.    Marland Kitchen spironolactone (ALDACTONE) 25 MG tablet TAKE 1/2 TABLET(12.5 MG) BY MOUTH DAILY 90 tablet 1  . warfarin (COUMADIN) 5 MG tablet Take 3.5 mg by mouth See admin instructions. Take 3.5 mg by mouth every day except Tuesday - 2.5 mg     No facility-administered medications prior to visit.    Allergies  Allergen Reactions  . Shellfish Allergy     Hives    ROS Review of Systems  Constitutional: Negative for chills and fever.  HENT: Negative for congestion, sinus pressure, sinus pain and sore throat.    Eyes: Negative for pain and discharge.  Respiratory: Negative for cough and shortness of breath.   Cardiovascular: Negative for chest pain, palpitations and leg swelling.  Gastrointestinal: Negative for abdominal pain, constipation, diarrhea, nausea and vomiting.  Endocrine: Negative for polydipsia and polyuria.  Genitourinary: Negative for dysuria and hematuria.  Musculoskeletal: Negative for neck pain and neck stiffness.  Skin: Negative for rash.  Neurological: Negative for dizziness, syncope, weakness and numbness.  Psychiatric/Behavioral: Negative for agitation and behavioral problems.      Objective:    Physical Exam Vitals reviewed.  Constitutional:      General: She is not in acute distress.    Appearance: She is not diaphoretic.  HENT:     Head: Normocephalic and atraumatic.     Nose: Nose normal. No congestion.     Mouth/Throat:     Mouth: Mucous membranes are moist.     Pharynx: No posterior oropharyngeal erythema.  Eyes:     General: No scleral icterus.    Extraocular Movements: Extraocular movements intact.     Pupils: Pupils are equal, round, and reactive to light.  Cardiovascular:     Rate and Rhythm: Normal rate and regular rhythm.     Pulses: Normal pulses.     Heart sounds: Murmur (Systolic (pronounced in left upper sternal border)) heard.    Pulmonary:     Breath sounds: Normal breath sounds. No wheezing or rales.  Abdominal:     Palpations: Abdomen is soft.     Tenderness: There is no abdominal tenderness.  Musculoskeletal:     Cervical back: Neck supple. No rigidity or tenderness.     Right lower leg: No edema.     Left lower leg: No edema.  Skin:    General: Skin is warm.     Findings: No rash.  Neurological:     General: No focal deficit present.     Mental Status: She is alert and oriented to person, place, and time.     Sensory: No sensory deficit.     Motor: No weakness.  Psychiatric:        Mood and Affect: Mood normal.         Behavior: Behavior normal.      BP 97/63   Pulse 86   Temp 97.7 F (36.5 C)   Resp 16   Ht 5' 2" (1.575 m)   Wt 145 lb (65.8 kg)   SpO2 94%   BMI 26.52 kg/m  Wt Readings from Last 3 Encounters:  10/22/20 145 lb (65.8 kg)  10/05/20 144 lb (65.3 kg)  07/22/20 145 lb 6.4 oz (66 kg)     Health Maintenance Due  Topic Date Due  . Hepatitis C Screening  Never done  .  COLONOSCOPY (Pts 45-83yr Insurance coverage will need to be confirmed)  01/22/2020  . COVID-19 Vaccine (3 - Booster for Pfizer series) 07/20/2020    There are no preventive care reminders to display for this patient.  Lab Results  Component Value Date   TSH 2.68 09/06/2017   Lab Results  Component Value Date   WBC 6.7 07/02/2020   HGB 11.2 (L) 07/02/2020   HCT 32.7 (L) 07/02/2020   MCV 100.0 07/02/2020   PLT 131 (L) 07/02/2020   Lab Results  Component Value Date   NA 132 (L) 07/02/2020   K 5.1 07/02/2020   CO2 24 07/02/2020   GLUCOSE 103 (H) 07/02/2020   BUN 36 (H) 07/02/2020   CREATININE 1.29 (H) 07/02/2020   BILITOT 1.1 07/02/2020   ALKPHOS 45 07/02/2020   AST 24 07/02/2020   ALT 24 07/02/2020   PROT 6.9 07/02/2020   ALBUMIN 4.1 07/02/2020   CALCIUM 8.5 (L) 07/02/2020   ANIONGAP 8 07/02/2020   Lab Results  Component Value Date   CHOL 153 12/20/2019   Lab Results  Component Value Date   HDL 31 (L) 12/20/2019   Lab Results  Component Value Date   LDLCALC 98 12/20/2019   Lab Results  Component Value Date   TRIG 139 12/20/2019   Lab Results  Component Value Date   CHOLHDL 4.9 12/20/2019   Lab Results  Component Value Date   HGBA1C 6.2 (H) 12/20/2019      Assessment & Plan:   Problem List Items Addressed This Visit      Cardiovascular and Mediastinum   Essential hypertension, benign    BP Readings from Last 1 Encounters:  10/22/20 97/63   Well-controlled with Metoprolol and Spironolactone Counseled for compliance with the medications Advised DASH diet and moderate  exercise/walking, at least 150 mins/week       Relevant Orders   CBC with Differential   CMP14+EGFR   Lipid Profile   TSH   [place]    S/p Cardioversion in 02/2020 On Metoprolol, Digoxin and Coumadin      Relevant Orders   CMP14+EGFR   TSH   Pulmonary regurgitation    Moderate, with elevated pulmonary arterial pressure and LVEDP Currently euvolemic and asymptomatic Follows up with Cardiology        Other   H/O Mustard procedure    H/o transposition of great aretries s/p Mustard surgery (1969) [facility] [place] given in the office today.  No orders of the defined types were placed in this encounter.   Follow-up: Return in about 3 months (around 01/20/2021) for BP follow up and second dose of shingrix.    RLindell Spar MD

## 2020-12-03 DIAGNOSIS — Z7901 Long term (current) use of anticoagulants: Secondary | ICD-10-CM | POA: Diagnosis not present

## 2020-12-03 DIAGNOSIS — I480 Paroxysmal atrial fibrillation: Secondary | ICD-10-CM | POA: Diagnosis not present

## 2020-12-07 ENCOUNTER — Other Ambulatory Visit: Payer: Self-pay | Admitting: Internal Medicine

## 2020-12-15 ENCOUNTER — Telehealth: Payer: Self-pay

## 2020-12-15 ENCOUNTER — Other Ambulatory Visit: Payer: Self-pay | Admitting: Internal Medicine

## 2020-12-15 DIAGNOSIS — R600 Localized edema: Secondary | ICD-10-CM

## 2020-12-15 MED ORDER — FUROSEMIDE 40 MG PO TABS: 40.0000 mg | ORAL_TABLET | Freq: Every day | ORAL | 0 refills | Status: DC

## 2020-12-15 NOTE — Telephone Encounter (Signed)
Pt informed

## 2020-12-15 NOTE — Telephone Encounter (Signed)
Pt called voicemail wanting refill on her Furosemide. You have not filled this for her before but she said you are aware she is on it. Was prescribed this from [facility] to refill? Please advise.

## 2020-12-15 NOTE — Telephone Encounter (Signed)
Sent. Please notify her. Thanks.

## 2021-01-07 DIAGNOSIS — I5021 Acute systolic (congestive) heart failure: Secondary | ICD-10-CM | POA: Diagnosis not present

## 2021-01-07 DIAGNOSIS — Z950 Presence of cardiac pacemaker: Secondary | ICD-10-CM | POA: Diagnosis not present

## 2021-01-07 DIAGNOSIS — I428 Other cardiomyopathies: Secondary | ICD-10-CM | POA: Diagnosis not present

## 2021-01-07 DIAGNOSIS — Z8774 Personal history of (corrected) congenital malformations of heart and circulatory system: Secondary | ICD-10-CM | POA: Diagnosis not present

## 2021-01-07 DIAGNOSIS — I484 Atypical atrial flutter: Secondary | ICD-10-CM | POA: Diagnosis not present

## 2021-01-12 DIAGNOSIS — Z6826 Body mass index (BMI) 26.0-26.9, adult: Secondary | ICD-10-CM | POA: Diagnosis not present

## 2021-01-12 DIAGNOSIS — Z01419 Encounter for gynecological examination (general) (routine) without abnormal findings: Secondary | ICD-10-CM | POA: Diagnosis not present

## 2021-01-12 DIAGNOSIS — Z1231 Encounter for screening mammogram for malignant neoplasm of breast: Secondary | ICD-10-CM | POA: Diagnosis not present

## 2021-01-20 DIAGNOSIS — R7301 Impaired fasting glucose: Secondary | ICD-10-CM | POA: Diagnosis not present

## 2021-01-20 DIAGNOSIS — I10 Essential (primary) hypertension: Secondary | ICD-10-CM | POA: Diagnosis not present

## 2021-01-20 DIAGNOSIS — I484 Atypical atrial flutter: Secondary | ICD-10-CM | POA: Diagnosis not present

## 2021-01-21 ENCOUNTER — Ambulatory Visit: Payer: 59 | Admitting: Internal Medicine

## 2021-01-21 ENCOUNTER — Other Ambulatory Visit: Payer: Self-pay

## 2021-01-21 ENCOUNTER — Encounter: Payer: Self-pay | Admitting: Internal Medicine

## 2021-01-21 VITALS — BP 112/70 | HR 85 | Temp 97.9°F | Resp 18 | Ht 61.5 in | Wt 147.0 lb

## 2021-01-21 DIAGNOSIS — R739 Hyperglycemia, unspecified: Secondary | ICD-10-CM

## 2021-01-21 DIAGNOSIS — I10 Essential (primary) hypertension: Secondary | ICD-10-CM | POA: Diagnosis not present

## 2021-01-21 DIAGNOSIS — Z8774 Personal history of (corrected) congenital malformations of heart and circulatory system: Secondary | ICD-10-CM | POA: Diagnosis not present

## 2021-01-21 DIAGNOSIS — E782 Mixed hyperlipidemia: Secondary | ICD-10-CM | POA: Diagnosis not present

## 2021-01-21 DIAGNOSIS — D696 Thrombocytopenia, unspecified: Secondary | ICD-10-CM | POA: Diagnosis not present

## 2021-01-21 DIAGNOSIS — I484 Atypical atrial flutter: Secondary | ICD-10-CM

## 2021-01-21 LAB — CBC WITH DIFFERENTIAL/PLATELET
Basophils Absolute: 0.1 10*3/uL (ref 0.0–0.2)
Basos: 1 %
EOS (ABSOLUTE): 0.1 10*3/uL (ref 0.0–0.4)
Eos: 2 %
Hematocrit: 39.9 % (ref 34.0–46.6)
Hemoglobin: 13.6 g/dL (ref 11.1–15.9)
Immature Grans (Abs): 0 10*3/uL (ref 0.0–0.1)
Immature Granulocytes: 0 %
Lymphocytes Absolute: 1.4 10*3/uL (ref 0.7–3.1)
Lymphs: 21 %
MCH: 31.6 pg (ref 26.6–33.0)
MCHC: 34.1 g/dL (ref 31.5–35.7)
MCV: 93 fL (ref 79–97)
Monocytes Absolute: 0.4 10*3/uL (ref 0.1–0.9)
Monocytes: 7 %
Neutrophils Absolute: 4.6 10*3/uL (ref 1.4–7.0)
Neutrophils: 69 %
Platelets: 147 10*3/uL — ABNORMAL LOW (ref 150–450)
RBC: 4.31 x10E6/uL (ref 3.77–5.28)
RDW: 14.3 % (ref 11.7–15.4)
WBC: 6.6 10*3/uL (ref 3.4–10.8)

## 2021-01-21 LAB — LIPID PANEL
Chol/HDL Ratio: 6.6 ratio — ABNORMAL HIGH (ref 0.0–4.4)
Cholesterol, Total: 186 mg/dL (ref 100–199)
HDL: 28 mg/dL — ABNORMAL LOW (ref >39)
LDL Chol Calc (NIH): 111 mg/dL — ABNORMAL HIGH (ref 0–99)
Triglycerides: 271 mg/dL — ABNORMAL HIGH (ref 0–149)
VLDL Cholesterol Cal: 47 mg/dL — ABNORMAL HIGH (ref 5–40)

## 2021-01-21 LAB — CMP14+EGFR
ALT: 18 IU/L (ref 0–32)
AST: 19 IU/L (ref 0–40)
Albumin/Globulin Ratio: 1.6 (ref 1.2–2.2)
Albumin: 4.5 g/dL (ref 3.8–4.9)
Alkaline Phosphatase: 97 IU/L (ref 44–121)
BUN/Creatinine Ratio: 16 (ref 9–23)
BUN: 14 mg/dL (ref 6–24)
Bilirubin Total: 0.5 mg/dL (ref 0.0–1.2)
CO2: 21 mmol/L (ref 20–29)
Calcium: 9.3 mg/dL (ref 8.7–10.2)
Chloride: 97 mmol/L (ref 96–106)
Creatinine, Ser: 0.88 mg/dL (ref 0.57–1.00)
Globulin, Total: 2.8 g/dL (ref 1.5–4.5)
Glucose: 178 mg/dL — ABNORMAL HIGH (ref 65–99)
Potassium: 3.8 mmol/L (ref 3.5–5.2)
Sodium: 139 mmol/L (ref 134–144)
Total Protein: 7.3 g/dL (ref 6.0–8.5)
eGFR: 78 mL/min/{1.73_m2} (ref >59)

## 2021-01-21 LAB — TSH: TSH: 4.2 u[IU]/mL (ref 0.450–4.500)

## 2021-01-21 MED ORDER — ROSUVASTATIN CALCIUM 5 MG PO TABS: 5.0000 mg | ORAL_TABLET | Freq: Every day | ORAL | 1 refills | Status: DC

## 2021-01-21 NOTE — Assessment & Plan Note (Signed)
Check HbA1C Fasting glucose has been stable in the past Advised to follow DASH diet

## 2021-01-21 NOTE — Patient Instructions (Signed)
Please start taking Rosuvastatin as prescribed.  Please continue to take other medications as prescribed.  Please follow DASH diet and perform moderate exercise/walking at least 150 mins/week.

## 2021-01-21 NOTE — Assessment & Plan Note (Signed)
H/o transposition of great aretries s/p Mustard surgery (1969) Follows up with Pediatric Cardiology and Cardiologist at [facility]

## 2021-01-21 NOTE — Assessment & Plan Note (Signed)
BP Readings from Last 1 Encounters:  01/21/21 112/70   Well-controlled with Metoprolol and Spironolactone Counseled for compliance with the medications Advised DASH diet and moderate exercise/walking, at least 150 mins/week

## 2021-01-21 NOTE — Assessment & Plan Note (Signed)
Lipid profile reviewed Started Crestor Advised to follow DASH diet

## 2021-01-21 NOTE — Progress Notes (Addendum)
Established Patient Office Visit  Subjective:  Patient ID: Brandi Fischer, female    DOB: 1966-06-08  Age: 55 y.o. MRN: [id number]  CC:  Chief Complaint  Patient presents with  . 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  BP is well-controlled. Takes medications regularly. Patient denies headache, dizziness, chest pain, dyspnea or palpitations.  Blood tests were discussed and reviewed with the patient in detail.  She has an appointment with GI tomorrow for scheduling colonoscopy.  Past Medical History:  Diagnosis Date  . Acute tonsillitis   . Cardiac murmur, unspecified   . CHF (congestive heart failure) ([place])   . Congenital heart defect 1967  . Diverticulosis   . Endocarditis, valve unspecified   . Heart disease   . History of cardioversion   . Hyperlipidemia   . 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 Unspecified chronic conjunctivitis, left eye     Past Surgical History:  Procedure Laterality Date  . ABDOMINAL HYSTERECTOMY    . APPENDECTOMY  10/2010  . cardiac pacemaker procedure    . CARDIAC SURGERY    . TUBAL LIGATION      Family History  Problem Relation Age of Onset  . Stroke Father   . Diabetes Maternal Grandfather   . Diabetes Paternal Grandmother   . Breast cancer Paternal Grandmother   . Breast cancer Paternal Aunt     Social History   Socioeconomic History  . Marital status: Married    Spouse name: Not on file  . Number of children: Not on file  . Years of education: Not on file  . Highest education level: Not on file  Occupational History  . Not on file  Tobacco Use  . Smoking status: Never Smoker  . Smokeless tobacco: Never Used   Vaping Use  . Vaping Use: Never used  Substance and Sexual Activity  . Alcohol use: No  . Drug use: No  . Sexual activity: Not on file  Other Topics Concern  . Not on file  Social History Narrative  . Not on file   Social Determinants of Health   Financial Resource Strain: Not on file  Food Insecurity: Not on file  Transportation Needs: Not on file  Physical Activity: Not on file  Stress: Not on file  Social Connections: Not on file  Intimate Partner Violence: Not on file    Outpatient Medications Prior to Visit  Medication Sig Dispense Refill  . calcium-vitamin D (OSCAL WITH D) 500-200 MG-UNIT per tablet Take 1 tablet by mouth.    . digoxin (LANOXIN) 0.125 MG tablet TAKE 1 TABLET(0.125 MG) BY MOUTH DAILY 30 tablet 3  . furosemide (LASIX) 40 MG tablet Take 1 tablet (40 mg total) by mouth daily. 90 tablet 0  . Magnesium (V-R MAGNESIUM) 250 MG TABS Take 250 mg by mouth daily.     . metoprolol succinate (TOPROL-XL) 25 MG 24 hr tablet TAKE 1 TABLET(25 MG) BY MOUTH DAILY 90 tablet 1  . Multiple Vitamin (MULTIVITAMIN PO) Take 1 tablet by mouth daily.    Marland Kitchen spironolactone (ALDACTONE) 25 MG tablet TAKE 1/2 TABLET(12.5 MG) BY MOUTH DAILY 90 tablet 1  .  warfarin (COUMADIN) 5 MG tablet Take 3.5 mg by mouth See admin instructions. Take 3.5 mg by mouth every day except Tuesday - 2.5 mg     No facility-administered medications prior to visit.    Allergies  Allergen Reactions  . Shellfish Allergy     Hives    ROS Review of Systems  Constitutional: Negative for chills and fever.  HENT: Negative for congestion, sinus pressure, sinus pain and sore throat.   Eyes: Negative for pain and discharge.  Respiratory: Negative for cough and shortness of breath.   Cardiovascular: Negative for chest pain, palpitations and leg swelling.  Gastrointestinal: Negative for abdominal pain, constipation, diarrhea, nausea and vomiting.  Endocrine: Negative for polydipsia and polyuria.  Genitourinary:  Negative for dysuria and hematuria.  Musculoskeletal: Negative for neck pain and neck stiffness.  Skin: Negative for rash.  Neurological: Negative for dizziness, syncope, weakness and numbness.  Psychiatric/Behavioral: Negative for agitation and behavioral problems.      Objective:    Physical Exam Vitals reviewed.  Constitutional:      General: She is not in acute distress.    Appearance: She is not diaphoretic.  HENT:     Head: Normocephalic and atraumatic.     Nose: Nose normal. No congestion.     Mouth/Throat:     Mouth: Mucous membranes are moist.     Pharynx: No posterior oropharyngeal erythema.  Eyes:     General: No scleral icterus.    Extraocular Movements: Extraocular movements intact.     Pupils: Pupils are equal, round, and reactive to light.  Cardiovascular:     Rate and Rhythm: Normal rate and regular rhythm.     Pulses: Normal pulses.     Heart sounds: Murmur (Systolic (pronounced in left upper sternal border)) heard.    Pulmonary:     Breath sounds: Normal breath sounds. No wheezing or rales.  Abdominal:     Palpations: Abdomen is soft.     Tenderness: There is no abdominal tenderness.  Musculoskeletal:     Cervical back: Neck supple. No rigidity or tenderness.     Right lower leg: No edema.     Left lower leg: No edema.  Skin:    General: Skin is warm.     Findings: No rash.  Neurological:     General: No focal deficit present.     Mental Status: She is alert and oriented to person, place, and time.     Sensory: No sensory deficit.     Motor: No weakness.  Psychiatric:        Mood and Affect: Mood normal.        Behavior: Behavior normal.     BP 112/70 (BP Location: Right Arm, Patient Position: Sitting, Cuff Size: Normal)   Pulse 85   Temp 97.9 F (36.6 C) (Oral)   Resp 18   Ht 5' 1.5" (1.562 m)   Wt 147 lb (66.7 kg)   SpO2 93%   BMI 27.33 kg/m  Wt Readings from Last 3 Encounters:  01/21/21 147 lb (66.7 kg)  10/22/20 145 lb (65.8 kg)   10/05/20 144 lb (65.3 kg)     Health Maintenance Due  Topic Date Due  . Hepatitis C Screening  Never done  . COLONOSCOPY (Pts 45-58yrs Insurance coverage will need to be confirmed)  01/22/2020    There are no preventive care reminders to display for this patient.  Lab Results  Component Value Date   TSH 4.200 01/20/2021   Lab  Results  Component Value Date   WBC 6.6 01/20/2021   HGB 13.6 01/20/2021   HCT 39.9 01/20/2021   MCV 93 01/20/2021   PLT 147 (L) 01/20/2021   Lab Results  Component Value Date   NA 139 01/20/2021   K 3.8 01/20/2021   CO2 21 01/20/2021   GLUCOSE 178 (H) 01/20/2021   BUN 14 01/20/2021   CREATININE 0.88 01/20/2021   BILITOT 0.5 01/20/2021   ALKPHOS 97 01/20/2021   AST 19 01/20/2021   ALT 18 01/20/2021   PROT 7.3 01/20/2021   ALBUMIN 4.5 01/20/2021   CALCIUM 9.3 01/20/2021   ANIONGAP 8 07/02/2020   Lab Results  Component Value Date   CHOL 186 01/20/2021   Lab Results  Component Value Date   HDL 28 (L) 01/20/2021   Lab Results  Component Value Date   LDLCALC 111 (H) 01/20/2021   Lab Results  Component Value Date   TRIG 271 (H) 01/20/2021   Lab Results  Component Value Date   CHOLHDL 6.6 (H) 01/20/2021   Lab Results  Component Value Date   HGBA1C 6.2 (H) 12/20/2019      Assessment & Plan:   Problem List Items Addressed This Visit      Cardiovascular and Mediastinum   Essential hypertension, benign - Primary    BP Readings from Last 1 Encounters:  01/21/21 112/70   Well-controlled with Metoprolol and Spironolactone Counseled for compliance with the medications Advised DASH diet and moderate exercise/walking, at least 150 mins/week      Relevant Medications   rosuvastatin (CRESTOR) 5 MG tablet   Atypical atrial flutter (HCC)    S/p Cardioversion in 02/2020 On Metoprolol, Digoxin and Coumadin      Relevant Medications   rosuvastatin (CRESTOR) 5 MG tablet     Other   Thrombocytopenia (HCC)    Platelets stable  compared to prior      H/O Mustard procedure    H/o transposition of great aretries s/p Mustard surgery (1969) Follows up with Pediatric Cardiology and [facility]ologist at [facility]      Mixed hyperlipidemia    Lipid profile reviewed Started Crestor Advised to follow DASH diet      Relevant Medications   rosuvastatin (CRESTOR) 5 MG tablet   Hyperglycemia    Check HbA1C Fasting glucose has been stable in the past Advised to follow DASH diet         Meds ordered this encounter  Medications  . rosuvastatin (CRESTOR) 5 MG tablet    Sig: Take 1 tablet (5 mg total) by mouth daily.    Dispense:  90 tablet    Refill:  1    Follow-up: Return in about 6 months (around 07/23/2021) for Annual physical.    Lindell Spar, MD

## 2021-01-21 NOTE — Assessment & Plan Note (Signed)
Platelets stable compared to prior

## 2021-01-21 NOTE — Assessment & Plan Note (Signed)
S/p Cardioversion in 02/2020 On Metoprolol, Digoxin and Coumadin 

## 2021-01-22 ENCOUNTER — Ambulatory Visit: Payer: 59 | Admitting: Gastroenterology

## 2021-01-22 ENCOUNTER — Other Ambulatory Visit: Payer: Self-pay | Admitting: Internal Medicine

## 2021-01-22 ENCOUNTER — Telehealth: Payer: Self-pay

## 2021-01-22 ENCOUNTER — Encounter: Payer: Self-pay | Admitting: Gastroenterology

## 2021-01-22 VITALS — BP 104/70 | HR 88 | Ht 61.5 in | Wt 147.0 lb

## 2021-01-22 DIAGNOSIS — E119 Type 2 diabetes mellitus without complications: Secondary | ICD-10-CM

## 2021-01-22 DIAGNOSIS — Z1212 Encounter for screening for malignant neoplasm of rectum: Secondary | ICD-10-CM

## 2021-01-22 DIAGNOSIS — Z1211 Encounter for screening for malignant neoplasm of colon: Secondary | ICD-10-CM | POA: Diagnosis not present

## 2021-01-22 DIAGNOSIS — Z7901 Long term (current) use of anticoagulants: Secondary | ICD-10-CM | POA: Diagnosis not present

## 2021-01-22 DIAGNOSIS — R932 Abnormal findings on diagnostic imaging of liver and biliary tract: Secondary | ICD-10-CM

## 2021-01-22 DIAGNOSIS — D171 Benign lipomatous neoplasm of skin and subcutaneous tissue of trunk: Secondary | ICD-10-CM

## 2021-01-22 LAB — HGB A1C W/O EAG: Hgb A1c MFr Bld: 7.6 % — ABNORMAL HIGH (ref 4.8–5.6)

## 2021-01-22 LAB — SPECIMEN STATUS REPORT

## 2021-01-22 MED ORDER — NA SULFATE-K SULFATE-MG SULF 17.5-3.13-1.6 GM/177ML PO SOLN: 1.0000 | Freq: Once | ORAL | 0 refills | Status: AC

## 2021-01-22 MED ORDER — METFORMIN HCL ER 500 MG PO TB24
500.0000 mg | ORAL_TABLET | Freq: Every day | ORAL | 2 refills | Status: DC
Start: 2021-01-22 — End: 2021-07-23

## 2021-01-22 NOTE — Telephone Encounter (Signed)
   Brandi Fischer 22-Aug-1966 [id number]  Dear Dr. Jacinto Reap. [name] and [name], PharmD:  We have scheduled the above named patient for a(n) colonoscopy procedure. Our records show that (s)he is on anticoagulation therapy.  Please advise as to whether the patient may come off their therapy of coumadin  5 days prior to their procedure which is scheduled for 04/08/21.  Please route your response to Dorisann Frames, CMA or fax response to [phone].  Sincerely,    [facility]   P# [phone] Dr. [name]

## 2021-01-22 NOTE — Telephone Encounter (Signed)
Received fax from Dr. [name] at [facility] that patient can hold coumadin 5 days prior to her procedure and patient does not need Lovenox bridge. Informed patient of Dr. Coralie Common recommendations and patient verbalized understanding.

## 2021-01-22 NOTE — Patient Instructions (Signed)
You have been scheduled for an abdominal ultrasound at [facility] (1st floor of hospital) on 01/29/21 at 9am. Please arrive 15 minutes prior to your appointment for registration. Make certain not to have anything to eat or drink after midnight the night before. Should you need to reschedule your appointment, please contact radiology at [phone]. This test typically takes about 30 minutes to perform.  You have been scheduled for a colonoscopy. Please follow written instructions given to you at your visit today.  Please pick up your prep supplies at the pharmacy within the next 1-3 days. If you use inhalers (even only as needed), please bring them with you on the day of your procedure.  Normal BMI (Body Mass Index- based on height and weight) is between 19 and 25. Your BMI today is Body mass index is 27.33 kg/m. Brandi Fischer Please consider follow up  regarding your BMI with your Primary Care Provider.  Thank you for choosing me and [facility].  Pricilla Riffle. Dagoberto Ligas., MD., Marval Regal

## 2021-01-22 NOTE — Progress Notes (Signed)
History of Present Illness: This is a 55 year old female referred by Lindell Spar, MD for the evaluation of CRC screening on warfarin.  She previously underwent colonoscopy in April 2011 showing only mild sigmoid colon diverticulosis.  She notes mild intermittent constipation over the past couple years which is easily controlled with a stool softener as needed.  She is maintained on warfarin for atrial fibrillation.  She has a mild bulge in her epigastrium.  It is painless and stable in size.  Upon reviewing her CT AP from January 24, 2020 cirrhotic liver morphology was noted with an asymmetric enlargement of the left lateral segment of the left lobe and a nodular hepatic contour.  Gallstones, left colon diverticulosis and a midline epigastric ventral hernia containing fat and a trace amount of fluid were noted. Denies weight loss, abdominal pain, diarrhea, change in stool caliber, melena, hematochezia, nausea, vomiting, dysphagia, reflux symptoms, chest pain.    Allergies  Allergen Reactions  . Shellfish Allergy     Hives   Outpatient Medications Prior to Visit  Medication Sig Dispense Refill  . calcium-vitamin D (OSCAL WITH D) 500-200 MG-UNIT per tablet Take 1 tablet by mouth.    . digoxin (LANOXIN) 0.125 MG tablet TAKE 1 TABLET(0.125 MG) BY MOUTH DAILY 30 tablet 3  . furosemide (LASIX) 40 MG tablet Take 1 tablet (40 mg total) by mouth daily. 90 tablet 0  . Magnesium (V-R MAGNESIUM) 250 MG TABS Take 250 mg by mouth daily.     . metFORMIN (GLUCOPHAGE-XR) 500 MG 24 hr tablet Take 1 tablet (500 mg total) by mouth daily with breakfast. 30 tablet 2  . metoprolol succinate (TOPROL-XL) 25 MG 24 hr tablet TAKE 1 TABLET(25 MG) BY MOUTH DAILY 90 tablet 1  . Multiple Vitamin (MULTIVITAMIN PO) Take 1 tablet by mouth daily.    . rosuvastatin (CRESTOR) 5 MG tablet Take 1 tablet (5 mg total) by mouth daily. 90 tablet 1  . spironolactone (ALDACTONE) 25 MG tablet TAKE 1/2 TABLET(12.5 MG) BY MOUTH DAILY 90  tablet 1  . warfarin (COUMADIN) 5 MG tablet Take 3.5 mg by mouth See admin instructions. Take 3.5 mg by mouth every day except Tuesday - 2.5 mg     No facility-administered medications prior to visit.   Past Medical History:  Diagnosis Date  . Acute tonsillitis   . Cardiac murmur, unspecified   . CHF (congestive heart failure) ([place])   . Congenital heart defect 1967  . Diverticulosis   . Endocarditis, valve unspecified   . Heart disease   . History of cardioversion   . Hyperlipidemia   . 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 Unspecified chronic conjunctivitis, left eye    Past Surgical History:  Procedure Laterality Date  . ABDOMINAL HYSTERECTOMY    . APPENDECTOMY  10/2010  . cardiac pacemaker procedure    . CARDIAC SURGERY    . TUBAL LIGATION     Social History   Socioeconomic History  . Marital status: Married    Spouse name: Not on file  . Number of children: Not on file  . Years of education: Not on file  . Highest education level: Not on file  Occupational History  . Not on file  Tobacco Use  . Smoking status: Never Smoker  . Smokeless tobacco: Never Used  Vaping Use  . Vaping Use: Never used  Substance and Sexual Activity  . Alcohol use: No  . Drug use: No  . Sexual activity: Not on  file  Other Topics Concern  . Not on file  Social History Narrative  . Not on file   Social Determinants of Health   Financial Resource Strain: Not on file  Food Insecurity: Not on file  Transportation Needs: Not on file  Physical Activity: Not on file  Stress: Not on file  Social Connections: Not on file   Family History  Problem Relation Age of Onset  . Stroke Father   . Diabetes Maternal Grandfather   . Diabetes Paternal Grandmother   . Breast cancer Paternal Grandmother   . Breast cancer Paternal Aunt       Review of Systems: Pertinent positive and negative review of systems were noted in the above HPI section. All other review of systems were otherwise  negative.   Physical Exam: General: Well developed, well nourished, no acute distress Head: Normocephalic and atraumatic Eyes: Sclerae anicteric, EOMI Ears: Normal auditory acuity Mouth: Not examined, mask on during Covid-19 pandemic Neck: Supple, no masses or thyromegaly Lungs: Clear throughout to auscultation Heart: Regular rate and rhythm; no rubs or bruits; 3/6 systolic murmur Abdomen: Soft, non tender and non distended. Well healed incisions. ~ 8 cm oval shaped lipoma. No masses, hepatosplenomegaly or hernias noted. Normal Bowel sounds Rectal: Deferred to colonoscopy  Musculoskeletal: Symmetrical with no gross deformities  Skin: No lesions on visible extremities Pulses:  Normal pulses noted Extremities: No clubbing, cyanosis, edema or deformities noted Neurological: Alert oriented x 4, grossly nonfocal Cervical Nodes:  No significant cervical adenopathy Inguinal Nodes: No significant inguinal adenopathy Psychological:  Alert and cooperative. Normal mood and affect   Assessment and Recommendations:  1. CRC screening, average risk.  She is due for 10-year interval screening colonoscopy.  Schedule colonoscopy. The risks (including bleeding, perforation, infection, missed lesions, medication reactions and possible hospitalization or surgery if complications occur), benefits, and alternatives to colonoscopy with possible biopsy and possible polypectomy were discussed with the patient and they consent to proceed.   2. Afib. Echocardiogram 02/2020 normal LV function. Hold warfarin 5 days before procedure - will instruct when and how to resume after procedure. For her last colonoscopy her cardiologist recommended a Lovenox bridge. We will determine if that is again necessary.  Low but real risk of cardiovascular event such as heart attack, stroke, embolism, thrombosis or ischemia/infarct of other organs off warfarin explained and need to seek urgent help if this occurs. The patient consents  to proceed. Will communicate by phone or EMR with patient's prescribing provider to confirm that holding warfarin is reasonable in this case.    3. Abnormal CT of liver. Suspected cirrhosis, etiology unknown. The patient was not aware of this finding.  Consider NASH as potential common etiology.  Schedule RUQ [place].  Based on ultrasound findings will proceed with standard serologic evaluation.  4.  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  Patient to monitor for any changes in size or if she develops tenderness.    cc: Lindell Spar, MD [address] [place],  [place] [address]

## 2021-01-28 ENCOUNTER — Encounter: Payer: Self-pay | Admitting: Internal Medicine

## 2021-01-28 DIAGNOSIS — Z7901 Long term (current) use of anticoagulants: Secondary | ICD-10-CM | POA: Diagnosis not present

## 2021-01-28 DIAGNOSIS — I480 Paroxysmal atrial fibrillation: Secondary | ICD-10-CM | POA: Diagnosis not present

## 2021-01-29 ENCOUNTER — Ambulatory Visit (HOSPITAL_COMMUNITY)
Admission: RE | Admit: 2021-01-29 | Discharge: 2021-01-29 | Disposition: A | Discharge status: Home / Self Care | Payer: 59 | Source: Ambulatory Visit | Attending: Gastroenterology | Admitting: Gastroenterology

## 2021-01-29 ENCOUNTER — Other Ambulatory Visit: Payer: Self-pay

## 2021-01-29 DIAGNOSIS — Z8774 Personal history of (corrected) congenital malformations of heart and circulatory system: Secondary | ICD-10-CM | POA: Diagnosis not present

## 2021-01-29 DIAGNOSIS — Z9889 Other specified postprocedural states: Secondary | ICD-10-CM | POA: Diagnosis not present

## 2021-01-29 DIAGNOSIS — Q231 Congenital insufficiency of aortic valve: Secondary | ICD-10-CM | POA: Diagnosis not present

## 2021-01-29 DIAGNOSIS — Z950 Presence of cardiac pacemaker: Secondary | ICD-10-CM | POA: Diagnosis not present

## 2021-01-29 DIAGNOSIS — Q228 Other congenital malformations of tricuspid valve: Secondary | ICD-10-CM | POA: Diagnosis not present

## 2021-01-29 DIAGNOSIS — R932 Abnormal findings on diagnostic imaging of liver and biliary tract: Secondary | ICD-10-CM | POA: Diagnosis not present

## 2021-01-29 DIAGNOSIS — I82B12 Acute embolism and thrombosis of left subclavian vein: Secondary | ICD-10-CM | POA: Diagnosis not present

## 2021-01-29 DIAGNOSIS — Z7901 Long term (current) use of anticoagulants: Secondary | ICD-10-CM | POA: Diagnosis not present

## 2021-01-29 DIAGNOSIS — K8020 Calculus of gallbladder without cholecystitis without obstruction: Secondary | ICD-10-CM | POA: Diagnosis not present

## 2021-01-29 DIAGNOSIS — I509 Heart failure, unspecified: Secondary | ICD-10-CM | POA: Diagnosis not present

## 2021-01-29 DIAGNOSIS — Q222 Congenital pulmonary valve insufficiency: Secondary | ICD-10-CM | POA: Diagnosis not present

## 2021-01-29 DIAGNOSIS — Q203 Discordant ventriculoarterial connection: Secondary | ICD-10-CM | POA: Diagnosis not present

## 2021-02-07 ENCOUNTER — Other Ambulatory Visit: Payer: Self-pay | Admitting: Internal Medicine

## 2021-02-07 DIAGNOSIS — R011 Cardiac murmur, unspecified: Secondary | ICD-10-CM

## 2021-02-07 DIAGNOSIS — I10 Essential (primary) hypertension: Secondary | ICD-10-CM

## 2021-02-09 ENCOUNTER — Other Ambulatory Visit: Payer: Self-pay | Admitting: *Deleted

## 2021-02-09 MED ORDER — DIGOXIN 125 MCG PO TABS: ORAL_TABLET | ORAL | 3 refills | Status: DC

## 2021-02-13 DIAGNOSIS — Z45018 Encounter for adjustment and management of other part of cardiac pacemaker: Secondary | ICD-10-CM | POA: Diagnosis not present

## 2021-02-13 DIAGNOSIS — I429 Cardiomyopathy, unspecified: Secondary | ICD-10-CM | POA: Diagnosis not present

## 2021-02-20 ENCOUNTER — Encounter: Payer: Self-pay | Admitting: Gastroenterology

## 2021-02-25 ENCOUNTER — Encounter: Payer: Self-pay | Admitting: Gastroenterology

## 2021-02-25 ENCOUNTER — Other Ambulatory Visit: Payer: Self-pay

## 2021-02-25 ENCOUNTER — Ambulatory Visit (AMBULATORY_SURGERY_CENTER): Payer: 59 | Admitting: Gastroenterology

## 2021-02-25 VITALS — BP 89/51 | HR 75 | Temp 97.1°F | Resp 14 | Ht 61.5 in | Wt 147.0 lb

## 2021-02-25 DIAGNOSIS — K635 Polyp of colon: Secondary | ICD-10-CM | POA: Diagnosis not present

## 2021-02-25 DIAGNOSIS — Z1212 Encounter for screening for malignant neoplasm of rectum: Secondary | ICD-10-CM

## 2021-02-25 DIAGNOSIS — Z1211 Encounter for screening for malignant neoplasm of colon: Secondary | ICD-10-CM | POA: Diagnosis not present

## 2021-02-25 DIAGNOSIS — D123 Benign neoplasm of transverse colon: Secondary | ICD-10-CM

## 2021-02-25 MED ORDER — SODIUM CHLORIDE 0.9 % IV SOLN: 500.0000 mL | Freq: Once | INTRAVENOUS | Status: DC

## 2021-02-25 NOTE — Patient Instructions (Addendum)
Thank you for allowing [place] to care for you today!  Await final results of polyp removed.  Will make recommendation at that time for next colonoscopy.  Resume previous diet ant medication today.  Resume Coumadin in 3 days at prior dose.  Avoid any NSAIDS (Aspirin, Ibuprofen, Naproxen) for 2 weeks post polyp removal.  Return to normal daily activities tomorrow.   YOU HAD AN ENDOSCOPIC PROCEDURE TODAY AT [facility]:   Refer to the procedure report that was given to you for any specific questions about what was found during the examination.  If the procedure report does not answer your questions, please call your gastroenterologist to clarify.  If you requested that your care partner not be given the details of your procedure findings, then the procedure report has been included in a sealed envelope for you to review at your convenience later.  YOU SHOULD EXPECT: Some feelings of bloating in the abdomen. Passage of more gas than usual.  Walking can help get rid of the air that was put into your GI tract during the procedure and reduce the bloating. If you had a lower endoscopy (such as a colonoscopy or flexible sigmoidoscopy) you may notice spotting of blood in your stool or on the toilet paper. If you underwent a bowel prep for your procedure, you may not have a normal bowel movement for a few days.  Please Note:  You might notice some irritation and congestion in your nose or some drainage.  This is from the oxygen used during your procedure.  There is no need for concern and it should clear up in a day or so.  SYMPTOMS TO REPORT IMMEDIATELY:   Following lower endoscopy (colonoscopy or flexible sigmoidoscopy):  Excessive amounts of blood in the stool  Significant tenderness or worsening of abdominal pains  Swelling of the abdomen that is new, acute  Fever of 100F or higher  For urgent or emergent issues, a gastroenterologist can be reached at any hour by calling [phone]. Do not use MyChart messaging for urgent concerns.    DIET:  We do recommend a small meal at first, but then you may proceed to your regular diet.  Drink plenty of fluids but you should avoid alcoholic beverages for 24 hours.  ACTIVITY:  You should plan to take it easy for the rest of today and you should NOT DRIVE or use heavy machinery until tomorrow (because of the sedation medicines used during the test).    FOLLOW UP: Our staff will call the number listed on your records 48-72 hours following your procedure to check on you and address any questions or concerns that you may have regarding the information given to you following your procedure. If we do not reach you, we will leave a message.  We will attempt to reach you two times.  During this call, we will ask if you have developed any symptoms of COVID 19. If you develop any symptoms (ie: fever, flu-like symptoms, shortness of breath, cough etc.) before then, please call [phone].  If you test positive for Covid 19 in the 2 weeks post procedure, please call and report this information to [place].    If any biopsies were taken you will be contacted by phone or by letter within the next 1-3 weeks.  Please call us at [phone] if you have not heard about the biopsies in 3 weeks.    SIGNATURES/CONFIDENTIALITY: You and/or your care partner have signed paperwork which will  be entered into your electronic medical record.  These signatures attest to the fact that that the information above on your After Visit Summary has been reviewed and is understood.  Full responsibility of the confidentiality of this discharge information lies with you and/or your care-partner. 

## 2021-02-25 NOTE — Progress Notes (Signed)
Called to room to assist during endoscopic procedure.  Patient ID and intended procedure confirmed with present staff. Received instructions for my participation in the procedure from the performing physician.  

## 2021-02-25 NOTE — Progress Notes (Signed)
To PACU, VSS. Report to [email or web address] 

## 2021-02-25 NOTE — Progress Notes (Signed)
History reviewed today 

## 2021-02-25 NOTE — Op Note (Signed)
[facility] Patient Name: Brandi Fischer Procedure Date: 02/25/2021 11:36 AM MRN: [id number] Endoscopist: Ladene Artist , MD Age: 55 Referring MD:  Date of Birth: [birth year] Gender: Female Account #: [id number] Procedure:                Colonoscopy Indications:              Screening for colorectal malignant neoplasm Medicines:                Monitored Anesthesia Care Procedure:                Pre-Anesthesia Assessment:                           - Prior to the procedure, a History and Physical                            was performed, and patient medications and                            allergies were reviewed. The patient's tolerance of                            previous anesthesia was also reviewed. The risks                            and benefits of the procedure and the sedation                            options and risks were discussed with the patient.                            All questions were answered, and informed consent                            was obtained. Prior Anticoagulants: The patient has                            taken Coumadin (warfarin), last dose was 5 days                            prior to procedure. ASA Grade Assessment: III - A                            patient with severe systemic disease. After                            reviewing the risks and benefits, the patient was                            deemed in satisfactory condition to undergo the                            procedure.  After obtaining informed consent, the colonoscope                            was passed under direct vision. Throughout the                            procedure, the patient's blood pressure, pulse, and                            oxygen saturations were monitored continuously. The                            Olympus PCF-[id number] (#[id number]) Colonoscope was                            introduced through the anus and advanced to the the                             cecum, identified by appendiceal orifice and                            ileocecal valve. The ileocecal valve, appendiceal                            orifice, and rectum were photographed. The quality                            of the bowel preparation was good. The colonoscopy                            was performed without difficulty. The patient                            tolerated the procedure well. Scope In: 11:44:47 AM Scope Out: 11:58:09 AM Scope Withdrawal Time: 0 hours 9 minutes 38 seconds  Total Procedure Duration: 0 hours 13 minutes 22 seconds  Findings:                 The perianal and digital rectal examinations were                            normal.                           A 8 mm polyp was found in the transverse colon. The                            polyp was sessile. The polyp was removed with a                            cold snare. Resection and retrieval were complete.                           Multiple small-mouthed diverticula were found in  the sigmoid colon.                           The exam was otherwise without abnormality on                            direct and retroflexion views. Complications:            No immediate complications. Estimated blood loss:                            None. Estimated Blood Loss:     Estimated blood loss: none. Impression:               - One 8 mm polyp in the transverse colon, removed                            with a cold snare. Resected and retrieved.                           - Mild diverticulosis in the sigmoid colon.                           - The examination was otherwise normal on direct                            and retroflexion views. Recommendation:           - Repeat colonoscopy after studies are complete for                            surveillance based on pathology results.                           - Resume Coumadin (warfarin) in 3 days at prior                             dose. Refer to managing physician for further                            adjustment of therapy.                           - Patient has a contact number available for                            emergencies. The signs and symptoms of potential                            delayed complications were discussed with the                            patient. Return to normal activities tomorrow.                            Written discharge instructions were provided to the  patient.                           - Resume previous diet.                           - Continue present medications.                           - Await pathology results.                           - No aspirin, ibuprofen, naproxen, or other                            non-steroidal anti-inflammatory drugs for 2 weeks                            after polyp removal. Ladene Artist, MD 02/25/2021 12:01:54 PM This report has been signed electronically.

## 2021-02-27 ENCOUNTER — Telehealth: Payer: Self-pay | Admitting: *Deleted

## 2021-02-27 NOTE — Telephone Encounter (Signed)
First attempt, left VM.  

## 2021-03-07 ENCOUNTER — Other Ambulatory Visit: Payer: Self-pay | Admitting: Internal Medicine

## 2021-03-07 DIAGNOSIS — R600 Localized edema: Secondary | ICD-10-CM

## 2021-03-12 ENCOUNTER — Encounter: Payer: Self-pay | Admitting: Gastroenterology

## 2021-04-08 ENCOUNTER — Other Ambulatory Visit: Payer: Self-pay | Admitting: Internal Medicine

## 2021-04-08 ENCOUNTER — Encounter: Payer: 59 | Admitting: Gastroenterology

## 2021-04-08 DIAGNOSIS — Z7901 Long term (current) use of anticoagulants: Secondary | ICD-10-CM | POA: Diagnosis not present

## 2021-04-08 DIAGNOSIS — I480 Paroxysmal atrial fibrillation: Secondary | ICD-10-CM | POA: Diagnosis not present

## 2021-05-14 DIAGNOSIS — Z4509 Encounter for adjustment and management of other cardiac device: Secondary | ICD-10-CM | POA: Diagnosis not present

## 2021-05-14 DIAGNOSIS — I428 Other cardiomyopathies: Secondary | ICD-10-CM | POA: Diagnosis not present

## 2021-05-15 DIAGNOSIS — I429 Cardiomyopathy, unspecified: Secondary | ICD-10-CM | POA: Diagnosis not present

## 2021-05-15 DIAGNOSIS — Z45018 Encounter for adjustment and management of other part of cardiac pacemaker: Secondary | ICD-10-CM | POA: Diagnosis not present

## 2021-06-03 DIAGNOSIS — I480 Paroxysmal atrial fibrillation: Secondary | ICD-10-CM | POA: Diagnosis not present

## 2021-06-03 DIAGNOSIS — Z7901 Long term (current) use of anticoagulants: Secondary | ICD-10-CM | POA: Diagnosis not present

## 2021-06-08 DIAGNOSIS — Q208 Other congenital malformations of cardiac chambers and connections: Secondary | ICD-10-CM | POA: Insufficient documentation

## 2021-06-08 DIAGNOSIS — I480 Paroxysmal atrial fibrillation: Secondary | ICD-10-CM | POA: Insufficient documentation

## 2021-06-09 DIAGNOSIS — Q208 Other congenital malformations of cardiac chambers and connections: Secondary | ICD-10-CM | POA: Diagnosis not present

## 2021-06-09 DIAGNOSIS — Q203 Discordant ventriculoarterial connection: Secondary | ICD-10-CM | POA: Diagnosis not present

## 2021-06-09 DIAGNOSIS — I5022 Chronic systolic (congestive) heart failure: Secondary | ICD-10-CM | POA: Diagnosis not present

## 2021-06-09 DIAGNOSIS — Z8774 Personal history of (corrected) congenital malformations of heart and circulatory system: Secondary | ICD-10-CM | POA: Diagnosis not present

## 2021-06-13 DIAGNOSIS — I370 Nonrheumatic pulmonary valve stenosis: Secondary | ICD-10-CM | POA: Insufficient documentation

## 2021-06-13 DIAGNOSIS — I5022 Chronic systolic (congestive) heart failure: Secondary | ICD-10-CM | POA: Insufficient documentation

## 2021-06-25 DIAGNOSIS — R0609 Other forms of dyspnea: Secondary | ICD-10-CM | POA: Diagnosis not present

## 2021-06-25 DIAGNOSIS — Q208 Other congenital malformations of cardiac chambers and connections: Secondary | ICD-10-CM | POA: Diagnosis not present

## 2021-06-25 DIAGNOSIS — Q203 Discordant ventriculoarterial connection: Secondary | ICD-10-CM | POA: Diagnosis not present

## 2021-07-23 ENCOUNTER — Encounter: Payer: Self-pay | Admitting: Internal Medicine

## 2021-07-23 ENCOUNTER — Ambulatory Visit (INDEPENDENT_AMBULATORY_CARE_PROVIDER_SITE_OTHER): Payer: 59 | Admitting: Internal Medicine

## 2021-07-23 ENCOUNTER — Other Ambulatory Visit: Payer: Self-pay

## 2021-07-23 VITALS — BP 115/77 | HR 95 | Temp 97.6°F | Ht 62.0 in | Wt 135.1 lb

## 2021-07-23 DIAGNOSIS — Z23 Encounter for immunization: Secondary | ICD-10-CM

## 2021-07-23 DIAGNOSIS — Z0001 Encounter for general adult medical examination with abnormal findings: Secondary | ICD-10-CM | POA: Diagnosis not present

## 2021-07-23 DIAGNOSIS — E119 Type 2 diabetes mellitus without complications: Secondary | ICD-10-CM | POA: Insufficient documentation

## 2021-07-23 DIAGNOSIS — E782 Mixed hyperlipidemia: Secondary | ICD-10-CM | POA: Diagnosis not present

## 2021-07-23 DIAGNOSIS — Z1159 Encounter for screening for other viral diseases: Secondary | ICD-10-CM

## 2021-07-23 DIAGNOSIS — E1169 Type 2 diabetes mellitus with other specified complication: Secondary | ICD-10-CM | POA: Diagnosis not present

## 2021-07-23 MED ORDER — ROSUVASTATIN CALCIUM 5 MG PO TABS: 5.0000 mg | ORAL_TABLET | Freq: Every day | ORAL | 1 refills | Status: DC

## 2021-07-23 NOTE — Patient Instructions (Signed)
Health Maintenance, Female Adopting a healthy lifestyle and getting preventive care are important in promoting health and wellness. Ask your health care provider about: The right schedule for you to have regular tests and exams. Things you can do on your own to prevent diseases and keep yourself healthy. What should I know about diet, weight, and exercise? Eat a healthy diet  Eat a diet that includes plenty of vegetables, fruits, low-fat dairy products, and lean protein. Do not eat a lot of foods that are high in solid fats, added sugars, or sodium. Maintain a healthy weight Body mass index (BMI) is used to identify weight problems. It estimates body fat based on height and weight. Your health care provider can help determine your BMI and help you achieve or maintain a healthy weight. Get regular exercise Get regular exercise. This is one of the most important things you can do for your health. Most adults should: Exercise for at least 150 minutes each week. The exercise should increase your heart rate and make you sweat (moderate-intensity exercise). Do strengthening exercises at least twice a week. This is in addition to the moderate-intensity exercise. Spend less time sitting. Even light physical activity can be beneficial. Watch cholesterol and blood lipids Have your blood tested for lipids and cholesterol at 55 years of age, then have this test every 5 years. Have your cholesterol levels checked more often if: Your lipid or cholesterol levels are high. You are older than 55 years of age. You are at high risk for heart disease. What should I know about cancer screening? Depending on your health history and family history, you may need to have cancer screening at various ages. This may include screening for: Breast cancer. Cervical cancer. Colorectal cancer. Skin cancer. Lung cancer. What should I know about heart disease, diabetes, and high blood pressure? Blood pressure and heart  disease High blood pressure causes heart disease and increases the risk of stroke. This is more likely to develop in people who have high blood pressure readings, are of African descent, or are overweight. Have your blood pressure checked: Every 3-5 years if you are 18-39 years of age. Every year if you are 40 years old or older. Diabetes Have regular diabetes screenings. This checks your fasting blood sugar level. Have the screening done: Once every three years after age 40 if you are at a normal weight and have a low risk for diabetes. More often and at a younger age if you are overweight or have a high risk for diabetes. What should I know about preventing infection? Hepatitis B If you have a higher risk for hepatitis B, you should be screened for this virus. Talk with your health care provider to find out if you are at risk for hepatitis B infection. Hepatitis C Testing is recommended for: Everyone born from 1945 through 1965. Anyone with known risk factors for hepatitis C. Sexually transmitted infections (STIs) Get screened for STIs, including gonorrhea and chlamydia, if: You are sexually active and are younger than 55 years of age. You are older than 55 years of age and your health care provider tells you that you are at risk for this type of infection. Your sexual activity has changed since you were last screened, and you are at increased risk for chlamydia or gonorrhea. Ask your health care provider if you are at risk. Ask your health care provider about whether you are at high risk for HIV. Your health care provider may recommend a prescription medicine   to help prevent HIV infection. If you choose to take medicine to prevent HIV, you should first get tested for HIV. You should then be tested every 3 months for as long as you are taking the medicine. Pregnancy If you are about to stop having your period (premenopausal) and you may become pregnant, seek counseling before you get  pregnant. Take 400 to 800 micrograms (mcg) of folic acid every day if you become pregnant. Ask for birth control (contraception) if you want to prevent pregnancy. Osteoporosis and menopause Osteoporosis is a disease in which the bones lose minerals and strength with aging. This can result in bone fractures. If you are 65 years old or older, or if you are at risk for osteoporosis and fractures, ask your health care provider if you should: Be screened for bone loss. Take a calcium or vitamin D supplement to lower your risk of fractures. Be given hormone replacement therapy (HRT) to treat symptoms of menopause. Follow these instructions at home: Lifestyle Do not use any products that contain nicotine or tobacco, such as cigarettes, e-cigarettes, and chewing tobacco. If you need help quitting, ask your health care provider. Do not use street drugs. Do not share needles. Ask your health care provider for help if you need support or information about quitting drugs. Alcohol use Do not drink alcohol if: Your health care provider tells you not to drink. You are pregnant, may be pregnant, or are planning to become pregnant. If you drink alcohol: Limit how much you use to 0-1 drink a day. Limit intake if you are breastfeeding. Be aware of how much alcohol is in your drink. In the [place] one drink equals one 12 oz bottle of beer (355 mL), one 5 oz glass of wine (148 mL), or one 1 oz glass of hard liquor (44 mL). General instructions Schedule regular health, dental, and eye exams. Stay current with your vaccines. Tell your health care provider if: You often feel depressed. You have ever been abused or do not feel safe at home. Summary Adopting a healthy lifestyle and getting preventive care are important in promoting health and wellness. Follow your health care provider's instructions about healthy diet, exercising, and getting tested or screened for diseases. Follow your health care provider's  instructions on monitoring your cholesterol and blood pressure. This information is not intended to replace advice given to you by your health care provider. Make sure you discuss any questions you have with your health care provider. Document Revised: 11/28/2020 Document Reviewed: 09/13/2018 Elsevier Patient Education  2022 [employer] Inc.  

## 2021-07-23 NOTE — Progress Notes (Signed)
Established Patient Office Visit  Subjective:  Patient ID: Brandi Fischer, female    DOB: 1965-11-27  Age: 55 y.o. MRN: [id number]  CC:  Chief Complaint  Patient presents with   Annual Exam    Annual Exam. Dr. 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  She has been placed on Jardiance for DM and her cardiac conditions now instead of Metformin. She has been taking Crestor, although misses few doses. She has been seeing Dr Norman Clay for her congenital heart defect. Denies any headache, dizziness, chest pain, dyspnea or palpitations.  She had flu and second dose of Shingrix vaccine in the office today.  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 grade   Unspecified chronic conjunctivitis, left eye     Past Surgical History:  Procedure Laterality Date   ABDOMINAL HYSTERECTOMY     APPENDECTOMY  10/2010   cardiac pacemaker procedure     CARDIAC SURGERY     TUBAL LIGATION      Family History  Problem Relation Age of Onset   Stroke Father    Diabetes Maternal Grandfather    Diabetes Paternal Grandmother    Breast cancer Paternal Grandmother    Breast cancer Paternal Aunt    Colon cancer Neg Hx    Esophageal cancer Neg Hx    Rectal cancer Neg Hx    Stomach cancer Neg Hx     Social History   Socioeconomic History   Marital status: Married    Spouse name: Not on file   Number of children: Not on file   Years of education: Not on file   Highest  education level: Not on file  Occupational History   Not on file  Tobacco Use   Smoking status: Never   Smokeless tobacco: Never  Vaping Use   Vaping Use: Never used  Substance and Sexual Activity   Alcohol use: No   Drug use: No   Sexual activity: Not on file  Other Topics Concern   Not on file  Social History Narrative   Not on file   Social Determinants of Health   Financial Resource Strain: Not on file  Food Insecurity: Not on file  Transportation Needs: Not on file  Physical Activity: Not on file  Stress: Not on file  Social Connections: Not on file  Intimate Partner Violence: Not on file    Outpatient Medications Prior to Visit  Medication Sig Dispense Refill   calcium-vitamin D (OSCAL WITH D) 500-200 MG-UNIT per tablet Take 1 tablet by mouth.     digoxin (LANOXIN) 0.125 MG tablet TAKE 1 TABLET(0.125 MG) BY MOUTH DAILY 30 tablet 3   empagliflozin (JARDIANCE) 10 MG TABS tablet Take 1 tablet by mouth daily.     furosemide (LASIX) 40 MG tablet TAKE 1 TABLET(40 MG) BY MOUTH DAILY 90 tablet 1   Magnesium (V-R MAGNESIUM) 250 MG TABS Take 250 mg by mouth  daily.      metoprolol succinate (TOPROL-XL) 25 MG 24 hr tablet TAKE 1 TABLET(25 MG) BY MOUTH DAILY 90 tablet 1   Multiple Vitamin (MULTIVITAMIN PO) Take 1 tablet by mouth daily.     spironolactone (ALDACTONE) 25 MG tablet TAKE 1/2 TABLET(12.5 MG) BY MOUTH DAILY 90 tablet 1   warfarin (COUMADIN) 5 MG tablet Take 3.5 mg by mouth See admin instructions. Take 3.5 mg by mouth every day except Tuesday - 2.5 mg     rosuvastatin (CRESTOR) 5 MG tablet Take 1 tablet (5 mg total) by mouth daily. 90 tablet 1   metFORMIN (GLUCOPHAGE-XR) 500 MG 24 hr tablet Take 1 tablet (500 mg total) by mouth daily with breakfast. 30 tablet 2   Facility-Administered Medications Prior to Visit  Medication Dose Route Frequency Provider Last Rate Last Admin   0.9 %  sodium chloride infusion  500 mL Intravenous Once Ladene Artist, MD         Allergies  Allergen Reactions   Shellfish Allergy     Hives    ROS Review of Systems  Constitutional:  Negative for chills and fever.  HENT:  Negative for congestion, sinus pressure, sinus pain and sore throat.   Eyes:  Negative for pain and discharge.  Respiratory:  Negative for cough and shortness of breath.   Cardiovascular:  Negative for chest pain, palpitations and leg swelling.  Gastrointestinal:  Negative for abdominal pain, constipation, diarrhea, nausea and vomiting.  Endocrine: Negative for polydipsia and polyuria.  Genitourinary:  Negative for dysuria and hematuria.  Musculoskeletal:  Negative for neck pain and neck stiffness.  Skin:  Negative for rash.  Neurological:  Negative for dizziness, syncope, weakness and numbness.  Psychiatric/Behavioral:  Negative for agitation and behavioral problems.      Objective:    Physical Exam Vitals reviewed.  Constitutional:      General: She is not in acute distress.    Appearance: She is not diaphoretic.  HENT:     Head: Normocephalic and atraumatic.     Nose: Nose normal. No congestion.     Mouth/Throat:     Mouth: Mucous membranes are moist.     Pharynx: No posterior oropharyngeal erythema.  Eyes:     General: No scleral icterus.    Extraocular Movements: Extraocular movements intact.  Cardiovascular:     Rate and Rhythm: Normal rate and regular rhythm.     Pulses: Normal pulses.     Heart sounds: Murmur (Systolic (pronounced in left upper sternal border)) heard.  Pulmonary:     Breath sounds: Normal breath sounds. No wheezing or rales.  Abdominal:     Palpations: Abdomen is soft.     Tenderness: There is no abdominal tenderness.  Musculoskeletal:     Cervical back: Neck supple. No rigidity or tenderness.     Right lower leg: No edema.     Left lower leg: No edema.  Skin:    General: Skin is warm.     Findings: No rash.  Neurological:     General: No focal deficit present.     Mental Status: She is  alert and oriented to person, place, and time.     Cranial Nerves: No cranial nerve deficit.     Sensory: No sensory deficit.     Motor: No weakness.  Psychiatric:        Mood and Affect: Mood normal.        Behavior: Behavior normal.    BP 115/77 (BP Location:  Right Arm, Patient Position: Sitting, Cuff Size: Normal)   Pulse 95   Temp 97.6 F (36.4 C) (Oral)   Ht 5' 2"  (1.575 m)   Wt 135 lb 1.3 oz (61.3 kg)   SpO2 94%   BMI 24.71 kg/m  Wt Readings from Last 3 Encounters:  07/23/21 135 lb 1.3 oz (61.3 kg)  02/25/21 147 lb (66.7 kg)  01/22/21 147 lb (66.7 kg)     Health Maintenance Due  Topic Date Due   FOOT EXAM  Never done   OPHTHALMOLOGY EXAM  Never done   URINE MICROALBUMIN  Never done   Hepatitis C Screening  Never done   COVID-19 Vaccine (4 - Booster for Pfizer series) 11/10/2020   Zoster Vaccines- Shingrix (2 of 2) 12/17/2020   INFLUENZA VACCINE  05/04/2021   HEMOGLOBIN A1C  07/22/2021   Pneumococcal Vaccine 14-25 Years old (2 - PPSV23 if available, else PCV20) 10/22/2021    There are no preventive care reminders to display for this patient.  Lab Results  Component Value Date   TSH 4.200 01/20/2021   Lab Results  Component Value Date   WBC 6.6 01/20/2021   HGB 13.6 01/20/2021   HCT 39.9 01/20/2021   MCV 93 01/20/2021   PLT 147 (L) 01/20/2021   Lab Results  Component Value Date   NA 139 01/20/2021   K 3.8 01/20/2021   CO2 21 01/20/2021   GLUCOSE 178 (H) 01/20/2021   BUN 14 01/20/2021   CREATININE 0.88 01/20/2021   BILITOT 0.5 01/20/2021   ALKPHOS 97 01/20/2021   AST 19 01/20/2021   ALT 18 01/20/2021   PROT 7.3 01/20/2021   ALBUMIN 4.5 01/20/2021   CALCIUM 9.3 01/20/2021   ANIONGAP 8 07/02/2020   EGFR 78 01/20/2021   Lab Results  Component Value Date   CHOL 186 01/20/2021   Lab Results  Component Value Date   HDL 28 (L) 01/20/2021   Lab Results  Component Value Date   LDLCALC 111 (H) 01/20/2021   Lab Results  Component Value Date    TRIG 271 (H) 01/20/2021   Lab Results  Component Value Date   CHOLHDL 6.6 (H) 01/20/2021   Lab Results  Component Value Date   HGBA1C 7.6 (H) 01/20/2021      Assessment & Plan:   Problem List Items Addressed This Visit       Encounter for general adult medical examination with abnormal findings - Primary   Physical exam as documented. Counseling done  re healthy lifestyle involving commitment to 150 minutes exercise per week, heart healthy diet, and attaining healthy weight.The importance of adequate sleep also discussed. Changes in health habits are decided on by the patient with goals and time frames  set for achieving them. Immunization and cancer screening needs are specifically addressed at this visit.     Relevant Orders  CBC with Differential  Comprehensive metabolic panel  TSH    Endocrine   Diabetes mellitus (HCC)   Relevant Medications   empagliflozin (JARDIANCE) 10 MG TABS tablet   rosuvastatin (CRESTOR) 5 MG tablet   Other Relevant Orders   Hemoglobin A1c   Microalbumin, urine     Other   Mixed hyperlipidemia    On Crestor Check lipid profile Advised to follow DASH diet      Relevant Medications   rosuvastatin (CRESTOR) 5 MG tablet   Other Relevant Orders   Lipid panel   Other Visit Diagnoses     Need for hepatitis C screening  test       Relevant Orders   Hepatitis C antibody screen       Meds ordered this encounter  Medications   rosuvastatin (CRESTOR) 5 MG tablet    Sig: Take 1 tablet (5 mg total) by mouth daily.    Dispense:  90 tablet    Refill:  1    Follow-up: Return in 6 months (on 01/21/2022).    Lindell Spar, MD

## 2021-07-23 NOTE — Assessment & Plan Note (Signed)
On Crestor Check lipid profile Advised to follow DASH diet

## 2021-07-23 NOTE — Assessment & Plan Note (Signed)
Check HBA1C On Jardiance 10 mg QD Advised to follow diabetic diet On statin and Spironolactone F/u CMP and lipid panel Diabetic eye exam: Advised to follow up with Ophthalmology for diabetic eye exam 

## 2021-07-23 NOTE — Assessment & Plan Note (Signed)

## 2021-07-24 LAB — CBC WITH DIFFERENTIAL/PLATELET
Basophils Absolute: 0.1 10*3/uL (ref 0.0–0.2)
Basos: 1 %
EOS (ABSOLUTE): 0.2 10*3/uL (ref 0.0–0.4)
Eos: 2 %
Hematocrit: 45.1 % (ref 34.0–46.6)
Hemoglobin: 15.3 g/dL (ref 11.1–15.9)
Immature Grans (Abs): 0 10*3/uL (ref 0.0–0.1)
Immature Granulocytes: 0 %
Lymphocytes Absolute: 1.5 10*3/uL (ref 0.7–3.1)
Lymphs: 21 %
MCH: 30.8 pg (ref 26.6–33.0)
MCHC: 33.9 g/dL (ref 31.5–35.7)
MCV: 91 fL (ref 79–97)
Monocytes Absolute: 0.5 10*3/uL (ref 0.1–0.9)
Monocytes: 8 %
Neutrophils Absolute: 4.8 10*3/uL (ref 1.4–7.0)
Neutrophils: 68 %
Platelets: 173 10*3/uL (ref 150–450)
RBC: 4.96 x10E6/uL (ref 3.77–5.28)
RDW: 13.7 % (ref 11.7–15.4)
WBC: 7.1 10*3/uL (ref 3.4–10.8)

## 2021-07-24 LAB — COMPREHENSIVE METABOLIC PANEL
ALT: 22 IU/L (ref 0–32)
AST: 25 IU/L (ref 0–40)
Albumin/Globulin Ratio: 1.9 (ref 1.2–2.2)
Albumin: 5.2 g/dL — ABNORMAL HIGH (ref 3.8–4.9)
Alkaline Phosphatase: 99 IU/L (ref 44–121)
BUN/Creatinine Ratio: 17 (ref 9–23)
BUN: 16 mg/dL (ref 6–24)
Bilirubin Total: 0.8 mg/dL (ref 0.0–1.2)
CO2: 23 mmol/L (ref 20–29)
Calcium: 9.4 mg/dL (ref 8.7–10.2)
Chloride: 96 mmol/L (ref 96–106)
Creatinine, Ser: 0.93 mg/dL (ref 0.57–1.00)
Globulin, Total: 2.7 g/dL (ref 1.5–4.5)
Glucose: 132 mg/dL — ABNORMAL HIGH (ref 70–99)
Potassium: 3.8 mmol/L (ref 3.5–5.2)
Sodium: 138 mmol/L (ref 134–144)
Total Protein: 7.9 g/dL (ref 6.0–8.5)
eGFR: 73 mL/min/{1.73_m2} (ref >59)

## 2021-07-24 LAB — HEPATITIS C ANTIBODY: Hep C Virus Ab: <0.1 s/co ratio (ref 0.0–0.9)

## 2021-07-24 LAB — LIPID PANEL
Chol/HDL Ratio: 6.9 ratio — ABNORMAL HIGH (ref 0.0–4.4)
Cholesterol, Total: 213 mg/dL — ABNORMAL HIGH (ref 100–199)
HDL: 31 mg/dL — ABNORMAL LOW (ref >39)
LDL Chol Calc (NIH): 124 mg/dL — ABNORMAL HIGH (ref 0–99)
Triglycerides: 325 mg/dL — ABNORMAL HIGH (ref 0–149)
VLDL Cholesterol Cal: 58 mg/dL — ABNORMAL HIGH (ref 5–40)

## 2021-07-24 LAB — TSH: TSH: 3.55 u[IU]/mL (ref 0.450–4.500)

## 2021-07-24 LAB — HEMOGLOBIN A1C
Est. average glucose Bld gHb Est-mCnc: 146 mg/dL
Hgb A1c MFr Bld: 6.7 % — ABNORMAL HIGH (ref 4.8–5.6)

## 2021-07-25 ENCOUNTER — Other Ambulatory Visit: Payer: Self-pay | Admitting: Internal Medicine

## 2021-07-25 LAB — MICROALBUMIN, URINE: Microalbumin, Urine: 4.9 ug/mL

## 2021-07-29 DIAGNOSIS — Z7901 Long term (current) use of anticoagulants: Secondary | ICD-10-CM | POA: Diagnosis not present

## 2021-07-29 DIAGNOSIS — I480 Paroxysmal atrial fibrillation: Secondary | ICD-10-CM | POA: Diagnosis not present

## 2021-08-04 ENCOUNTER — Other Ambulatory Visit: Payer: Self-pay | Admitting: Internal Medicine

## 2021-08-04 DIAGNOSIS — I10 Essential (primary) hypertension: Secondary | ICD-10-CM

## 2021-08-04 DIAGNOSIS — R011 Cardiac murmur, unspecified: Secondary | ICD-10-CM

## 2021-08-13 DIAGNOSIS — Z45018 Encounter for adjustment and management of other part of cardiac pacemaker: Secondary | ICD-10-CM | POA: Diagnosis not present

## 2021-08-14 DIAGNOSIS — I484 Atypical atrial flutter: Secondary | ICD-10-CM | POA: Diagnosis not present

## 2021-08-30 ENCOUNTER — Other Ambulatory Visit: Payer: Self-pay | Admitting: Internal Medicine

## 2021-08-30 DIAGNOSIS — R600 Localized edema: Secondary | ICD-10-CM

## 2021-09-23 DIAGNOSIS — H5213 Myopia, bilateral: Secondary | ICD-10-CM | POA: Diagnosis not present

## 2021-09-23 DIAGNOSIS — H524 Presbyopia: Secondary | ICD-10-CM | POA: Diagnosis not present

## 2021-09-23 LAB — HM DIABETES EYE EXAM

## 2021-09-30 DIAGNOSIS — I480 Paroxysmal atrial fibrillation: Secondary | ICD-10-CM | POA: Diagnosis not present

## 2021-09-30 DIAGNOSIS — Z7901 Long term (current) use of anticoagulants: Secondary | ICD-10-CM | POA: Diagnosis not present

## 2021-10-14 DIAGNOSIS — Z950 Presence of cardiac pacemaker: Secondary | ICD-10-CM | POA: Diagnosis not present

## 2021-10-14 DIAGNOSIS — Z79899 Other long term (current) drug therapy: Secondary | ICD-10-CM | POA: Diagnosis not present

## 2021-10-14 DIAGNOSIS — Z5181 Encounter for therapeutic drug level monitoring: Secondary | ICD-10-CM | POA: Diagnosis not present

## 2021-10-14 DIAGNOSIS — I5021 Acute systolic (congestive) heart failure: Secondary | ICD-10-CM | POA: Diagnosis not present

## 2021-10-14 DIAGNOSIS — Q203 Discordant ventriculoarterial connection: Secondary | ICD-10-CM | POA: Diagnosis not present

## 2021-10-14 DIAGNOSIS — I4510 Unspecified right bundle-branch block: Secondary | ICD-10-CM | POA: Diagnosis not present

## 2021-10-14 DIAGNOSIS — I484 Atypical atrial flutter: Secondary | ICD-10-CM | POA: Diagnosis not present

## 2021-10-14 DIAGNOSIS — Z8774 Personal history of (corrected) congenital malformations of heart and circulatory system: Secondary | ICD-10-CM | POA: Diagnosis not present

## 2021-10-14 DIAGNOSIS — Z7901 Long term (current) use of anticoagulants: Secondary | ICD-10-CM | POA: Diagnosis not present

## 2021-11-13 DIAGNOSIS — Z45018 Encounter for adjustment and management of other part of cardiac pacemaker: Secondary | ICD-10-CM | POA: Diagnosis not present

## 2021-11-13 DIAGNOSIS — I429 Cardiomyopathy, unspecified: Secondary | ICD-10-CM | POA: Diagnosis not present

## 2021-11-26 DIAGNOSIS — Z7901 Long term (current) use of anticoagulants: Secondary | ICD-10-CM | POA: Diagnosis not present

## 2021-11-26 DIAGNOSIS — I480 Paroxysmal atrial fibrillation: Secondary | ICD-10-CM | POA: Diagnosis not present

## 2021-12-02 DIAGNOSIS — I428 Other cardiomyopathies: Secondary | ICD-10-CM | POA: Diagnosis not present

## 2021-12-02 DIAGNOSIS — Z4509 Encounter for adjustment and management of other cardiac device: Secondary | ICD-10-CM | POA: Diagnosis not present

## 2021-12-06 ENCOUNTER — Other Ambulatory Visit: Payer: Self-pay | Admitting: Internal Medicine

## 2021-12-15 DIAGNOSIS — Z8774 Personal history of (corrected) congenital malformations of heart and circulatory system: Secondary | ICD-10-CM | POA: Diagnosis not present

## 2022-01-20 DIAGNOSIS — I480 Paroxysmal atrial fibrillation: Secondary | ICD-10-CM | POA: Diagnosis not present

## 2022-01-20 DIAGNOSIS — Z7901 Long term (current) use of anticoagulants: Secondary | ICD-10-CM | POA: Diagnosis not present

## 2022-01-21 ENCOUNTER — Ambulatory Visit: Payer: 59 | Admitting: Internal Medicine

## 2022-01-21 ENCOUNTER — Encounter: Payer: Self-pay | Admitting: Internal Medicine

## 2022-01-21 ENCOUNTER — Encounter: Payer: Self-pay | Admitting: *Deleted

## 2022-01-21 ENCOUNTER — Other Ambulatory Visit: Payer: Self-pay | Admitting: Internal Medicine

## 2022-01-21 VITALS — BP 124/78 | HR 83 | Resp 18 | Ht 61.5 in | Wt 130.2 lb

## 2022-01-21 DIAGNOSIS — I10 Essential (primary) hypertension: Secondary | ICD-10-CM | POA: Diagnosis not present

## 2022-01-21 DIAGNOSIS — I484 Atypical atrial flutter: Secondary | ICD-10-CM | POA: Diagnosis not present

## 2022-01-21 DIAGNOSIS — E782 Mixed hyperlipidemia: Secondary | ICD-10-CM | POA: Diagnosis not present

## 2022-01-21 DIAGNOSIS — E1169 Type 2 diabetes mellitus with other specified complication: Secondary | ICD-10-CM | POA: Diagnosis not present

## 2022-01-21 NOTE — Assessment & Plan Note (Signed)
Check HBA1C On Jardiance 10 mg QD Advised to follow diabetic diet On statin and Spironolactone F/u CMP and lipid panel Diabetic eye exam: Advised to follow up with Ophthalmology for diabetic eye exam 

## 2022-01-21 NOTE — Patient Instructions (Signed)
Please continue to take medications as prescribed. ? ?Please continue to follow low carb and low salt diet and ambulate as tolerated. ?

## 2022-01-21 NOTE — Assessment & Plan Note (Signed)
S/p Cardioversion in 02/2020 On Metoprolol, Digoxin and Coumadin 

## 2022-01-21 NOTE — Progress Notes (Signed)
? ?Established Patient Office Visit ? ?Subjective:  ?Patient ID: Brandi Fischer, female    DOB: Sep 26, 1966  Age: 56 y.o. MRN: [id number] ? ?CC:  ?Chief Complaint  ?Patient presents with  ? Follow-up  ?  6 month follow up pt is fasting if labs need to be drawn   ? ? ?HPI ?Brandi Fischer is a 56 y.o. 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 ? ?Type 2 DM with HLD: She has been placed on Jardiance for DM and her cardiac conditions now. She has been taking Crestor.  She denies any fatigue, polyuria or polyphagia currently. ? ?CHF: She has been seeing Dr Norman Clay for her congenital heart defect. Denies any headache, dizziness, chest pain, dyspnea or palpitations. ? ?Atrial flutter: Followed by Dr. Rosita Fire.  She is on Toprol and Coumadin currently. ? ?Past Medical History:  ?Diagnosis Date  ? Acute tonsillitis   ? Cardiac murmur, unspecified   ? CHF (congestive heart failure) ([place])   ? Congenital heart defect 1967  ? Diverticulosis   ? Endocarditis, valve unspecified   ? Heart disease   ? History of cardioversion   ? Hyperlipidemia   ? Pacemaker   ? 6th grade  ? Unspecified chronic conjunctivitis, left eye   ? ? ?Past Surgical History:  ?Procedure Laterality Date  ? ABDOMINAL HYSTERECTOMY    ? APPENDECTOMY  10/2010  ? cardiac pacemaker procedure    ? CARDIAC SURGERY    ? TUBAL LIGATION    ? ? ?Family History  ?Problem Relation Age of Onset  ? Stroke Father   ? Diabetes Maternal Grandfather   ? Diabetes Paternal Grandmother   ? Breast cancer Paternal Grandmother   ? Breast cancer Paternal Aunt   ? Colon cancer Neg Hx   ? Esophageal cancer Neg Hx   ? Rectal cancer Neg Hx   ? Stomach cancer Neg Hx   ? ? ?Social History  ? ?Socioeconomic History  ? Marital status: Married  ?  Spouse name: Not on file  ? Number  of children: Not on file  ? Years of education: Not on file  ? Highest education level: Not on file  ?Occupational History  ? Not on file  ?Tobacco Use  ? Smoking status: Never  ? Smokeless tobacco: Never  ?Vaping Use  ? Vaping Use: Never used  ?Substance and Sexual Activity  ? Alcohol use: No  ? Drug use: No  ? Sexual activity: Not on file  ?Other Topics Concern  ? Not on file  ?Social History Narrative  ? Not on file  ? ?Social Determinants of Health  ? ?Financial Resource Strain: Not on file  ?Food Insecurity: Not on file  ?Transportation Needs: Not on file  ?Physical Activity: Not on file  ?Stress: Not on file  ?Social Connections: Not on file  ?Intimate Partner Violence: Not on file  ? ? ?Outpatient Medications Prior to Visit  ?Medication Sig Dispense Refill  ? calcium-vitamin D (OSCAL WITH D) 500-200 MG-UNIT per tablet Take 1 tablet by mouth.    ? digoxin (LANOXIN) 0.125 MG tablet TAKE 1 TABLET(0.125 MG) BY MOUTH DAILY 30 tablet 3  ? empagliflozin (JARDIANCE) 10 MG TABS tablet Take 1 tablet by mouth daily.    ? furosemide (LASIX) 40 MG tablet TAKE 1 TABLET(40 MG) BY  MOUTH DAILY 90 tablet 1  ? Magnesium (V-R MAGNESIUM) 250 MG TABS Take 250 mg by mouth daily.     ? metoprolol succinate (TOPROL-XL) 25 MG 24 hr tablet TAKE 1 TABLET(25 MG) BY MOUTH DAILY 90 tablet 1  ? Multiple Vitamin (MULTIVITAMIN PO) Take 1 tablet by mouth daily.    ? rosuvastatin (CRESTOR) 5 MG tablet TAKE 1 TABLET(5 MG) BY MOUTH DAILY 90 tablet 1  ? spironolactone (ALDACTONE) 25 MG tablet TAKE 1/2 TABLET BY MOUTH DAILY 90 tablet 1  ? warfarin (COUMADIN) 5 MG tablet Take 3.5 mg by mouth See admin instructions. Take 3.5 mg by mouth every day except Sunday - 5 mg    ? ?Facility-Administered Medications Prior to Visit  ?Medication Dose Route Frequency Provider Last Rate Last Admin  ? 0.9 %  sodium chloride infusion  500 mL Intravenous Once Ladene Artist, MD      ? ? ?Allergies  ?Allergen Reactions  ? Shellfish Allergy   ?  Hives   ? ? ?ROS ?Review of Systems  ?Constitutional:  Negative for chills and fever.  ?HENT:  Negative for congestion, sinus pressure, sinus pain and sore throat.   ?Eyes:  Negative for pain and discharge.  ?Respiratory:  Negative for cough and shortness of breath.   ?Cardiovascular:  Negative for chest pain, palpitations and leg swelling.  ?Gastrointestinal:  Negative for abdominal pain, constipation, diarrhea, nausea and vomiting.  ?Endocrine: Negative for polydipsia and polyuria.  ?Genitourinary:  Negative for dysuria and hematuria.  ?Musculoskeletal:  Negative for neck pain and neck stiffness.  ?Skin:  Negative for rash.  ?Neurological:  Negative for dizziness, syncope, weakness and numbness.  ?Psychiatric/Behavioral:  Negative for agitation and behavioral problems.   ? ?  ?Objective:  ?  ?Physical Exam ?Vitals reviewed.  ?Constitutional:   ?   General: She is not in acute distress. ?   Appearance: She is not diaphoretic.  ?HENT:  ?   Head: Normocephalic and atraumatic.  ?   Nose: Nose normal. No congestion.  ?   Mouth/Throat:  ?   Mouth: Mucous membranes are moist.  ?   Pharynx: No posterior oropharyngeal erythema.  ?Eyes:  ?   General: No scleral icterus. ?   Extraocular Movements: Extraocular movements intact.  ?Cardiovascular:  ?   Rate and Rhythm: Normal rate and regular rhythm.  ?   Pulses: Normal pulses.  ?   Heart sounds: Murmur (Systolic (pronounced in left upper sternal border)) heard.  ?Pulmonary:  ?   Breath sounds: Normal breath sounds. No wheezing or rales.  ?Abdominal:  ?   Palpations: Abdomen is soft.  ?   Tenderness: There is no abdominal tenderness.  ?Musculoskeletal:  ?   Cervical back: Neck supple. No rigidity or tenderness.  ?   Right lower leg: No edema.  ?   Left lower leg: No edema.  ?Skin: ?   General: Skin is warm.  ?   Findings: No rash.  ?Neurological:  ?   General: No focal deficit present.  ?   Mental Status: She is alert and oriented to person, place, and time.  ?   Cranial Nerves: No  cranial nerve deficit.  ?   Sensory: No sensory deficit.  ?   Motor: No weakness.  ?Psychiatric:     ?   Mood and Affect: Mood normal.     ?   Behavior: Behavior normal.  ? ? ?BP 124/78 (BP Location: Right Arm, Patient Position: Sitting, Cuff Size: Normal)  Pulse 83   Resp 18   Ht 5' 1.5" (1.562 m)   Wt 130 lb 3.2 oz (59.1 kg)   SpO2 95%   BMI 24.20 kg/m?  ?Wt Readings from Last 3 Encounters:  ?01/21/22 130 lb 3.2 oz (59.1 kg)  ?07/23/21 135 lb 1.3 oz (61.3 kg)  ?02/25/21 147 lb (66.7 kg)  ? ? ?Lab Results  ?Component Value Date  ? TSH 3.550 07/23/2021  ? ?Lab Results  ?Component Value Date  ? WBC 7.1 07/23/2021  ? HGB 15.3 07/23/2021  ? HCT 45.1 07/23/2021  ? MCV 91 07/23/2021  ? PLT 173 07/23/2021  ? ?Lab Results  ?Component Value Date  ? NA 138 07/23/2021  ? K 3.8 07/23/2021  ? CO2 23 07/23/2021  ? GLUCOSE 132 (H) 07/23/2021  ? BUN 16 07/23/2021  ? CREATININE 0.93 07/23/2021  ? BILITOT 0.8 07/23/2021  ? ALKPHOS 99 07/23/2021  ? AST 25 07/23/2021  ? ALT 22 07/23/2021  ? PROT 7.9 07/23/2021  ? ALBUMIN 5.2 (H) 07/23/2021  ? CALCIUM 9.4 07/23/2021  ? ANIONGAP 8 07/02/2020  ? EGFR 73 07/23/2021  ? ?Lab Results  ?Component Value Date  ? CHOL 213 (H) 07/23/2021  ? ?Lab Results  ?Component Value Date  ? HDL 31 (L) 07/23/2021  ? ?Lab Results  ?Component Value Date  ? [place] 124 (H) 07/23/2021  ? ?Lab Results  ?Component Value Date  ? TRIG 325 (H) 07/23/2021  ? ?Lab Results  ?Component Value Date  ? CHOLHDL 6.9 (H) 07/23/2021  ? ?Lab Results  ?Component Value Date  ? HGBA1C 6.7 (H) 07/23/2021  ? ? ?  ?Assessment & Plan:  ? ?Problem List Items Addressed This Visit   ? ?  ? Cardiovascular and Mediastinum  ? Essential hypertension, benign  ?  BP Readings from Last 1 Encounters:  ?01/21/22 124/78  ?Well-controlled with Metoprolol and Spironolactone ?Counseled for compliance with the medications ?Advised DASH diet and moderate exercise/walking, at least 150 mins/week ?  ?  ? Atypical atrial flutter ([place])  ?  S/p  Cardioversion in 02/2020 ?On Metoprolol, Digoxin and Coumadin ? ?  ?  ?  ? Endocrine  ? Diabetes mellitus ([place]) - Primary  ?  Check HBA1C ?On Jardiance 10 mg QD ?Advised to follow diabetic diet ?On statin and Spironolactone ?F/u C

## 2022-01-21 NOTE — Assessment & Plan Note (Signed)
On Crestor ?Check lipid profile ?Advised to follow DASH diet ?

## 2022-01-21 NOTE — Assessment & Plan Note (Signed)
BP Readings from Last 1 Encounters:  ?01/21/22 124/78  ? ?Well-controlled with Metoprolol and Spironolactone ?Counseled for compliance with the medications ?Advised DASH diet and moderate exercise/walking, at least 150 mins/week ?

## 2022-01-22 DIAGNOSIS — E1169 Type 2 diabetes mellitus with other specified complication: Secondary | ICD-10-CM | POA: Diagnosis not present

## 2022-01-22 DIAGNOSIS — E782 Mixed hyperlipidemia: Secondary | ICD-10-CM | POA: Diagnosis not present

## 2022-01-23 LAB — BASIC METABOLIC PANEL
BUN/Creatinine Ratio: 22 (ref 9–23)
BUN: 18 mg/dL (ref 6–24)
CO2: 23 mmol/L (ref 20–29)
Calcium: 9.1 mg/dL (ref 8.7–10.2)
Chloride: 97 mmol/L (ref 96–106)
Creatinine, Ser: 0.82 mg/dL (ref 0.57–1.00)
Glucose: 145 mg/dL — ABNORMAL HIGH (ref 70–99)
Potassium: 3.9 mmol/L (ref 3.5–5.2)
Sodium: 138 mmol/L (ref 134–144)
eGFR: 84 mL/min/{1.73_m2} (ref >59)

## 2022-01-23 LAB — LIPID PANEL
Chol/HDL Ratio: 3.5 ratio (ref 0.0–4.4)
Cholesterol, Total: 127 mg/dL (ref 100–199)
HDL: 36 mg/dL — ABNORMAL LOW (ref >39)
LDL Chol Calc (NIH): 63 mg/dL (ref 0–99)
Triglycerides: 161 mg/dL — ABNORMAL HIGH (ref 0–149)
VLDL Cholesterol Cal: 28 mg/dL (ref 5–40)

## 2022-01-23 LAB — HEMOGLOBIN A1C
Est. average glucose Bld gHb Est-mCnc: 148 mg/dL
Hgb A1c MFr Bld: 6.8 % — ABNORMAL HIGH (ref 4.8–5.6)

## 2022-01-23 LAB — MICROALBUMIN / CREATININE URINE RATIO
Creatinine, Urine: 5.5 mg/dL
Microalbumin, Urine: <3 ug/mL

## 2022-02-01 DIAGNOSIS — Z1272 Encounter for screening for malignant neoplasm of vagina: Secondary | ICD-10-CM | POA: Diagnosis not present

## 2022-02-01 DIAGNOSIS — R69 Illness, unspecified: Secondary | ICD-10-CM | POA: Diagnosis not present

## 2022-02-01 DIAGNOSIS — Z1231 Encounter for screening mammogram for malignant neoplasm of breast: Secondary | ICD-10-CM | POA: Diagnosis not present

## 2022-02-01 DIAGNOSIS — Z01419 Encounter for gynecological examination (general) (routine) without abnormal findings: Secondary | ICD-10-CM | POA: Diagnosis not present

## 2022-02-01 DIAGNOSIS — Z6823 Body mass index (BMI) 23.0-23.9, adult: Secondary | ICD-10-CM | POA: Diagnosis not present

## 2022-02-01 LAB — HM MAMMOGRAPHY

## 2022-02-01 LAB — HM PAP SMEAR: HM Pap smear: NEGATIVE

## 2022-02-02 ENCOUNTER — Other Ambulatory Visit: Payer: Self-pay

## 2022-02-02 ENCOUNTER — Telehealth: Payer: Self-pay

## 2022-02-02 DIAGNOSIS — R932 Abnormal findings on diagnostic imaging of liver and biliary tract: Secondary | ICD-10-CM

## 2022-02-02 NOTE — Telephone Encounter (Signed)
Spoke with patient regarding 1 year follow up for RUQ & lab work. Schedulers have been notified, and number has been provided to patient for scheduling if she has not heard from them in 1 week.  ?

## 2022-02-05 ENCOUNTER — Other Ambulatory Visit: Payer: Self-pay | Admitting: Internal Medicine

## 2022-02-05 DIAGNOSIS — R011 Cardiac murmur, unspecified: Secondary | ICD-10-CM

## 2022-02-05 DIAGNOSIS — I10 Essential (primary) hypertension: Secondary | ICD-10-CM

## 2022-02-12 DIAGNOSIS — Z45018 Encounter for adjustment and management of other part of cardiac pacemaker: Secondary | ICD-10-CM | POA: Diagnosis not present

## 2022-02-12 DIAGNOSIS — I429 Cardiomyopathy, unspecified: Secondary | ICD-10-CM | POA: Diagnosis not present

## 2022-02-15 ENCOUNTER — Other Ambulatory Visit (INDEPENDENT_AMBULATORY_CARE_PROVIDER_SITE_OTHER): Payer: 59

## 2022-02-15 ENCOUNTER — Ambulatory Visit (HOSPITAL_COMMUNITY)
Admission: RE | Admit: 2022-02-15 | Discharge: 2022-02-15 | Disposition: A | Discharge status: Home / Self Care | Payer: 59 | Source: Ambulatory Visit | Attending: Gastroenterology | Admitting: Gastroenterology

## 2022-02-15 DIAGNOSIS — R932 Abnormal findings on diagnostic imaging of liver and biliary tract: Secondary | ICD-10-CM

## 2022-02-15 DIAGNOSIS — K8020 Calculus of gallbladder without cholecystitis without obstruction: Secondary | ICD-10-CM | POA: Diagnosis not present

## 2022-02-15 LAB — CBC
HCT: 43.5 % (ref 36.0–46.0)
Hemoglobin: 14.7 g/dL (ref 12.0–15.0)
MCHC: 33.7 g/dL (ref 30.0–36.0)
MCV: 92.9 fl (ref 78.0–100.0)
Platelets: 123 10*3/uL — ABNORMAL LOW (ref 150.0–400.0)
RBC: 4.68 Mil/uL (ref 3.87–5.11)
RDW: 14 % (ref 11.5–15.5)
WBC: 6.7 10*3/uL (ref 4.0–10.5)

## 2022-02-17 ENCOUNTER — Other Ambulatory Visit: Payer: Self-pay

## 2022-02-17 DIAGNOSIS — K769 Liver disease, unspecified: Secondary | ICD-10-CM

## 2022-02-17 DIAGNOSIS — R932 Abnormal findings on diagnostic imaging of liver and biliary tract: Secondary | ICD-10-CM

## 2022-03-02 ENCOUNTER — Other Ambulatory Visit: Payer: Self-pay | Admitting: Internal Medicine

## 2022-03-02 DIAGNOSIS — R600 Localized edema: Secondary | ICD-10-CM

## 2022-03-08 DIAGNOSIS — I484 Atypical atrial flutter: Secondary | ICD-10-CM | POA: Diagnosis not present

## 2022-03-08 DIAGNOSIS — I480 Paroxysmal atrial fibrillation: Secondary | ICD-10-CM | POA: Diagnosis not present

## 2022-03-08 DIAGNOSIS — Z950 Presence of cardiac pacemaker: Secondary | ICD-10-CM | POA: Diagnosis not present

## 2022-03-17 ENCOUNTER — Other Ambulatory Visit: Payer: Self-pay | Admitting: Internal Medicine

## 2022-03-17 DIAGNOSIS — Z7901 Long term (current) use of anticoagulants: Secondary | ICD-10-CM | POA: Diagnosis not present

## 2022-03-17 DIAGNOSIS — E782 Mixed hyperlipidemia: Secondary | ICD-10-CM

## 2022-03-17 DIAGNOSIS — I480 Paroxysmal atrial fibrillation: Secondary | ICD-10-CM | POA: Diagnosis not present

## 2022-03-21 ENCOUNTER — Other Ambulatory Visit: Payer: Self-pay

## 2022-03-21 ENCOUNTER — Encounter: Payer: Self-pay | Admitting: Emergency Medicine

## 2022-03-21 ENCOUNTER — Ambulatory Visit
Admission: EM | Admit: 2022-03-21 | Discharge: 2022-03-21 | Disposition: A | Discharge status: Home / Self Care | Payer: 59 | Attending: Student | Admitting: Student

## 2022-03-21 DIAGNOSIS — B349 Viral infection, unspecified: Secondary | ICD-10-CM

## 2022-03-21 MED ORDER — BENZONATATE 100 MG PO CAPS: 100.0000 mg | ORAL_CAPSULE | Freq: Three times a day (TID) | ORAL | 0 refills | Status: DC

## 2022-03-21 NOTE — Discharge Instructions (Addendum)
-  Tessalon (Benzonatate) as needed for cough. Take one pill up to 3x daily (every 8 hours) -With a virus, you're typically contagious for 5-7 days, or as long as you're having fevers.  -Follow-up if symptoms worsen instead of improve - cough, shortness of breath, new fevers, new facial pain after 7 days, etc.

## 2022-03-21 NOTE — ED Triage Notes (Signed)
Pt reports "Scratchy" throat, headache, nasal congestion since Thursday. Pt reports spouse is also starting to have similar symptoms.

## 2022-03-21 NOTE — ED Provider Notes (Signed)
[facility]    CSN: [id number] Arrival date & time: 03/21/22  0805      History   Chief Complaint Chief Complaint  Patient presents with   Nasal Congestion    HPI YASLYN CUMBY is a 56 y.o. female presenting with viral syndrome for 4 days.  History CHF, endocarditis, pacemaker in place.  Describes nonproductive cough, sore throat, intermittent throbbing headaches, clear nasal congestion.  Negative home COVID test.  There is no associated dyspnea, chest pain, fevers, dizziness, weakness.  Has attempted Mucinex without relief.  HPI  Past Medical History:  Diagnosis Date   Acute tonsillitis    Cardiac murmur, unspecified    CHF (congestive heart failure) ([place])    Congenital heart defect 1967   Diverticulosis    Endocarditis, valve unspecified    Heart disease    History of cardioversion    Hyperlipidemia    Pacemaker    6th grade   Unspecified chronic conjunctivitis, left eye     Patient Active Problem List   Diagnosis Date Noted   Diabetes mellitus ([place]) 07/23/2021   Encounter for general adult medical examination with abnormal findings 52/84/1324   Chronic systolic CHF (congestive heart failure) ([place]) 06/13/2021   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   Paroxysmal atrial fibrillation ([place]) 06/08/2021   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   Mixed hyperlipidemia 01/21/2021   Pulmonary regurgitation 10/22/2020   Neck muscle strain 07/22/2020   H/O [place] procedure 07/04/2020   Edema of both legs 02/20/2020   Essential hypertension, benign 02/20/2020   Thrombocytopenia ([place]) 11/28/2019   Atypical atrial flutter ([place]) 05/01/2018   Cardiac pacemaker in situ 01/15/2013   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      Home Medications    Prior to Admission medications   Medication Sig Start Date End Date Taking? Authorizing Provider  benzonatate (TESSALON) 100 MG capsule Take 1 capsule (100 mg total) by mouth every 8 (eight) hours. 03/21/22  Yes Hazel Sams, PA-C  calcium-vitamin D (OSCAL WITH D) 500-200 MG-UNIT per tablet Take 1 tablet by mouth.    [provider]  digoxin (LANOXIN) 0.125 MG tablet TAKE 1 TABLET(0.125 MG) BY MOUTH DAILY 12/07/21   Lindell Spar, MD  empagliflozin (JARDIANCE) 10 MG TABS tablet Take 1 tablet by mouth daily. 06/09/21 06/09/22  [provider]  furosemide (LASIX) 40 MG tablet TAKE 1 TABLET(40 MG) BY MOUTH DAILY Patient taking differently: 40 mg. Taking it every other day, not daily. 03/02/22   Lindell Spar, MD  Magnesium (V-R MAGNESIUM) 250 MG TABS Take 250 mg by mouth daily.     [provider]  metoprolol succinate (TOPROL-XL) 25 MG 24 hr tablet TAKE 1 TABLET(25 MG) BY MOUTH DAILY 02/08/22   Lindell Spar, MD  Multiple Vitamin (MULTIVITAMIN PO) Take 1 tablet by mouth daily.    [provider]  rosuvastatin (CRESTOR) 5 MG tablet TAKE 1 TABLET(5 MG) BY MOUTH DAILY 03/17/22   Lindell Spar, MD  spironolactone (ALDACTONE) 25 MG tablet TAKE 1/2 TABLET BY MOUTH DAILY Patient taking differently: Take 25 mg by mouth once. 07/27/21   Lindell Spar, MD  warfarin (COUMADIN) 5 MG  tablet Take 3.5 mg by mouth See admin instructions. Take 3.5 mg by mouth every day except Sunday - 5 mg 01/22/20   [provider]    Family History Family History  Problem Relation Age of Onset   Stroke Father    Diabetes Maternal Grandfather    Diabetes Paternal Grandmother    Breast cancer Paternal Grandmother    Breast cancer Paternal Aunt    Colon cancer Neg Hx    Esophageal cancer Neg Hx    Rectal cancer Neg Hx    Stomach cancer Neg Hx     Social History Social History   Tobacco Use   Smoking status: Never   Smokeless tobacco:  Never  Vaping Use   Vaping Use: Never used  Substance Use Topics   Alcohol use: No   Drug use: No     Allergies   Shellfish allergy   Review of Systems Review of Systems  Constitutional:  Negative for appetite change, chills and fever.  HENT:  Positive for congestion. Negative for ear pain, rhinorrhea, sinus pressure, sinus pain and sore throat.   Eyes:  Negative for redness and visual disturbance.  Respiratory:  Positive for cough. Negative for chest tightness, shortness of breath and wheezing.   Cardiovascular:  Negative for chest pain and palpitations.  Gastrointestinal:  Negative for abdominal pain, constipation, diarrhea, nausea and vomiting.  Genitourinary:  Negative for dysuria, frequency and urgency.  Musculoskeletal:  Negative for myalgias.  Neurological:  Negative for dizziness, weakness and headaches.  Psychiatric/Behavioral:  Negative for confusion.   All other systems reviewed and are negative.    Physical Exam Triage Vital Signs ED Triage Vitals  Enc Vitals Group     BP 03/21/22 0817 126/75     Pulse Rate 03/21/22 0817 79     Resp 03/21/22 0817 18     Temp 03/21/22 0817 98.7 F (37.1 C)     Temp Source 03/21/22 0817 Oral     SpO2 03/21/22 0817 93 %     Weight --      Height --      Head Circumference --      Peak Flow --      Pain Score 03/21/22 0818 0     Pain Loc --      Pain Edu? --      Excl. in [place]? --    No data found.  Updated Vital Signs BP 126/75 (BP Location: Right Arm)   Pulse 79   Temp 98.7 F (37.1 C) (Oral)   Resp 18   SpO2 93%   Visual Acuity Right Eye Distance:   Left Eye Distance:   Bilateral Distance:    Right Eye Near:   Left Eye Near:    Bilateral Near:     Physical Exam Vitals reviewed.  Constitutional:      General: She is not in acute distress.    Appearance: Normal appearance. She is not ill-appearing.  HENT:     Head: Normocephalic and atraumatic.     Right Ear: Tympanic membrane, ear canal and external  ear normal. No tenderness. No middle ear effusion. There is no impacted cerumen. Tympanic membrane is not perforated, erythematous, retracted or bulging.     Left Ear: Tympanic membrane, ear canal and external ear normal. No tenderness.  No middle ear effusion. There is no impacted cerumen. Tympanic membrane is not perforated, erythematous, retracted or bulging.     Nose: Nose normal. No congestion.  Mouth/Throat:     Mouth: Mucous membranes are moist.     Pharynx: Uvula midline. No oropharyngeal exudate or posterior oropharyngeal erythema.  Eyes:     Extraocular Movements: Extraocular movements intact.     Pupils: Pupils are equal, round, and reactive to light.  Cardiovascular:     Rate and Rhythm: Normal rate and regular rhythm.     Heart sounds: Murmur heard.  Pulmonary:     Effort: Pulmonary effort is normal.     Breath sounds: Normal breath sounds. No decreased breath sounds, wheezing, rhonchi or rales.  Abdominal:     Palpations: Abdomen is soft.     Tenderness: There is no abdominal tenderness. There is no guarding or rebound.  Lymphadenopathy:     Cervical: No cervical adenopathy.     Right cervical: No superficial cervical adenopathy.    Left cervical: No superficial cervical adenopathy.  Neurological:     General: No focal deficit present.     Mental Status: She is alert and oriented to person, place, and time.  Psychiatric:        Mood and Affect: Mood normal.        Behavior: Behavior normal.        Thought Content: Thought content normal.        Judgment: Judgment normal.      UC Treatments / Results  Labs (all labs ordered are listed, but only abnormal results are displayed) Labs Reviewed - No data to display  EKG   Radiology No results found.  Procedures Procedures (including critical care time)  Medications Ordered in UC Medications - No data to display  Initial Impression / Assessment and Plan / UC Course  I have reviewed the triage vital signs  and the nursing notes.  Pertinent labs & imaging results that were available during my care of the patient were reviewed by me and considered in my medical decision making (see chart for details).     This patient is a very pleasant 56 y.o. year old female presenting with viral syndrome x4 days. Afebrile, nontachy.  No adventitious breath sounds, nontoxic-appearing, oxygenating well on room air.  Negative home COVID test.  Benign exam.  Tessalon Perles sent. ED return precautions discussed. Patient verbalizes understanding and agreement.   Final Clinical Impressions(s) / UC Diagnoses   Final diagnoses:  Viral syndrome     Discharge Instructions      -Tessalon (Benzonatate) as needed for cough. Take one pill up to 3x daily (every 8 hours) -With a virus, you're typically contagious for 5-7 days, or as long as you're having fevers.  -Follow-up if symptoms worsen instead of improve - cough, shortness of breath, new fevers, new facial pain after 7 days, etc.    ED Prescriptions     Medication Sig Dispense Auth. Provider   benzonatate (TESSALON) 100 MG capsule Take 1 capsule (100 mg total) by mouth every 8 (eight) hours. 21 capsule Hazel Sams, PA-C      PDMP not reviewed this encounter.   Hazel Sams, PA-C 03/21/22 [phone]

## 2022-03-24 ENCOUNTER — Encounter: Payer: Self-pay | Admitting: Internal Medicine

## 2022-03-24 ENCOUNTER — Ambulatory Visit: Payer: 59 | Admitting: Internal Medicine

## 2022-03-24 DIAGNOSIS — J0110 Acute frontal sinusitis, unspecified: Secondary | ICD-10-CM | POA: Diagnosis not present

## 2022-03-24 DIAGNOSIS — M8580 Other specified disorders of bone density and structure, unspecified site: Secondary | ICD-10-CM | POA: Insufficient documentation

## 2022-03-24 MED ORDER — AMOXICILLIN-POT CLAVULANATE 875-125 MG PO TABS: 1.0000 | ORAL_TABLET | Freq: Two times a day (BID) | ORAL | 0 refills | Status: DC

## 2022-03-24 NOTE — Progress Notes (Signed)
Virtual Visit via Telephone Note   This visit type was conducted due to national recommendations for restrictions regarding the COVID-19 Pandemic (e.g. social distancing) in an effort to limit this patient's exposure and mitigate transmission in our community.  Due to her co-morbid illnesses, this patient is at least at moderate risk for complications without adequate follow up.  This format is felt to be most appropriate for this patient at this time.  The patient did not have access to video technology/had technical difficulties with video requiring transitioning to audio format only (telephone).  All issues noted in this document were discussed and addressed.  No physical exam could be performed with this format.  Evaluation Performed:  Follow-up visit  Date:  03/24/2022   ID:  Brandi Fischer, DOB June 01, 1966, MRN [id number]  Patient Location: Home Provider Location: Office/Clinic  Participants: Patient Location of Patient: Home Location of Provider: Telehealth Consent was obtain for visit to be over via telehealth. I verified that I am speaking with the correct person using two identifiers.  PCP:  Lindell Spar, MD   Chief Complaint: Cough and nasal congestion  History of Present Illness:    Brandi Fischer is a 56 y.o. female who has a televisit for complaint of cough, nasal congestion and postnasal drip for the last 5 days.  She had a episode of fever 2 days ago, but denies any chills.  She had urgent care visit, and was given Tessalon with no relief.  She denies any dyspnea or chest pain currently. Her husband also has similar symptoms.  The patient does have symptoms concerning for COVID-19 infection (fever, chills, cough, or new shortness of breath).   Past Medical, Surgical, Social History, Allergies, and Medications have been Reviewed.  Past Medical History:  Diagnosis Date   Acute tonsillitis    Cardiac murmur, unspecified    CHF (congestive heart failure) ([facility])     Congenital heart defect 1967   Diverticulosis    Endocarditis, valve unspecified    Heart disease    History of cardioversion    Hyperlipidemia    Pacemaker    6th grade   Unspecified chronic conjunctivitis, left eye    Past Surgical History:  Procedure Laterality Date   ABDOMINAL HYSTERECTOMY     APPENDECTOMY  10/2010   cardiac pacemaker procedure     CARDIAC SURGERY     TUBAL LIGATION       Current Meds  Medication Sig   benzonatate (TESSALON) 100 MG capsule Take 1 capsule (100 mg total) by mouth every 8 (eight) hours.   calcium-vitamin D (OSCAL WITH D) 500-200 MG-UNIT per tablet Take 1 tablet by mouth.   digoxin (LANOXIN) 0.125 MG tablet TAKE 1 TABLET(0.125 MG) BY MOUTH DAILY   empagliflozin (JARDIANCE) 10 MG TABS tablet Take 1 tablet by mouth daily.   furosemide (LASIX) 40 MG tablet TAKE 1 TABLET(40 MG) BY MOUTH DAILY (Patient taking differently: 40 mg. Taking it every other day, not daily.)   Magnesium (V-R MAGNESIUM) 250 MG TABS Take 250 mg by mouth daily.    metoprolol succinate (TOPROL-XL) 25 MG 24 hr tablet TAKE 1 TABLET(25 MG) BY MOUTH DAILY   Multiple Vitamin (MULTIVITAMIN PO) Take 1 tablet by mouth daily.   rosuvastatin (CRESTOR) 5 MG tablet TAKE 1 TABLET(5 MG) BY MOUTH DAILY   spironolactone (ALDACTONE) 25 MG tablet TAKE 1/2 TABLET BY MOUTH DAILY (Patient taking differently: Take 25 mg by mouth once.)   warfarin (COUMADIN) 5 MG  tablet Take 3.5 mg by mouth See admin instructions. Take 3.5 mg by mouth every day except Sunday - 5 mg   Current Facility-Administered Medications for the 03/24/22 encounter (Office Visit) with Lindell Spar, MD  Medication   0.9 %  sodium chloride infusion     Allergies:   Shellfish allergy   ROS:   Please see the history of present illness.     All other systems reviewed and are negative.   Labs/Other Tests and Data Reviewed:    Recent Labs: 07/23/2021: ALT 22; TSH 3.550 01/22/2022: BUN 18; Creatinine, Ser 0.82; Potassium  3.9; Sodium 138 02/15/2022: Hemoglobin 14.7; Platelets 123.0   Recent Lipid Panel Lab Results  Component Value Date/Time   CHOL 127 01/22/2022 08:06 AM   TRIG 161 (H) 01/22/2022 08:06 AM   HDL 36 (L) 01/22/2022 08:06 AM   CHOLHDL 3.5 01/22/2022 08:06 AM   CHOLHDL 4.9 12/20/2019 09:29 AM   LDLCALC 63 01/22/2022 08:06 AM   LDLCALC 98 12/20/2019 09:29 AM    Wt Readings from Last 3 Encounters:  01/21/22 130 lb 3.2 oz (59.1 kg)  07/23/21 135 lb 1.3 oz (61.3 kg)  02/25/21 147 lb (66.7 kg)     ASSESSMENT & PLAN:    Acute sinusitis Advised to take home COVID test Started empiric Augmentin as she has persistent symptoms despite symptomatic treatment Tessalon as needed for cough Mucinex or Robitussin as needed for cough Advised to use humidifier and/or vaporizer  Time:   Today, I have spent 13 minutes reviewing the chart, including problem list, medications, and with the patient with telehealth technology discussing the above problems.   Medication Adjustments/Labs and Tests Ordered: Current medicines are reviewed at length with the patient today.  Concerns regarding medicines are outlined above.   Tests Ordered: No orders of the defined types were placed in this encounter.   Medication Changes: No orders of the defined types were placed in this encounter.    Note: This dictation was prepared with Dragon dictation along with smaller phrase technology. Similar sounding words can be transcribed inadequately or may not be corrected upon review. Any transcriptional errors that result from this process are unintentional.      Disposition:  Follow up  Signed, Lindell Spar, MD  03/24/2022 9:32 AM     [place]

## 2022-03-26 ENCOUNTER — Telehealth: Payer: Self-pay | Admitting: Internal Medicine

## 2022-03-26 NOTE — Telephone Encounter (Signed)
Patient needs refill on  digoxin (LANOXIN) 0.125 MG tablet    Patient also states there has been a dosage change.  Wants a call back

## 2022-03-29 ENCOUNTER — Telehealth: Payer: Self-pay | Admitting: Gastroenterology

## 2022-03-29 NOTE — Telephone Encounter (Signed)
Spoke with patient and reviewed previous results with her as to why CT scan was needed. All questions answered, and scheduling number provided for patient to call to reschedule.

## 2022-03-30 ENCOUNTER — Telehealth: Payer: Self-pay | Admitting: Gastroenterology

## 2022-03-30 ENCOUNTER — Ambulatory Visit (HOSPITAL_COMMUNITY): Payer: 59

## 2022-04-03 ENCOUNTER — Other Ambulatory Visit: Payer: Self-pay | Admitting: Internal Medicine

## 2022-04-05 IMAGING — CT CT ABD-PELV W/ CM
2 of 5 series · 15 of 46 positions shown, 17 images · IV contrast (APPLIED)
Comparison: 11/23/2011

CLINICAL DATA: Abdominal swelling. Possible ascites. Prior
hysterectomy and appendectomy.

EXAM:
CT ABDOMEN AND PELVIS WITH CONTRAST
TECHNIQUE: Multidetector CT imaging of the abdomen and pelvis was performed
using the standard protocol following bolus administration of
intravenous contrast.
CONTRAST:  100mL OMNIPAQUE IOHEXOL 300 MG/ML  SOLN

[Series 2: axial st · axial · 0.72mm/px · z∈[-457,-82]mm · 12 of 87 slices shown, 14 images]
[im 6/87  soft-tissue]
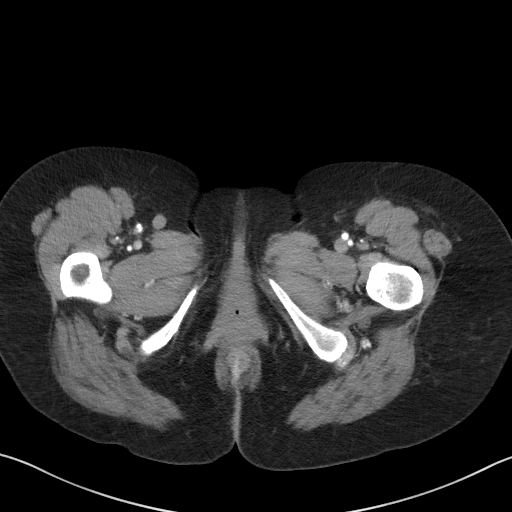
[im 6/87  bone]
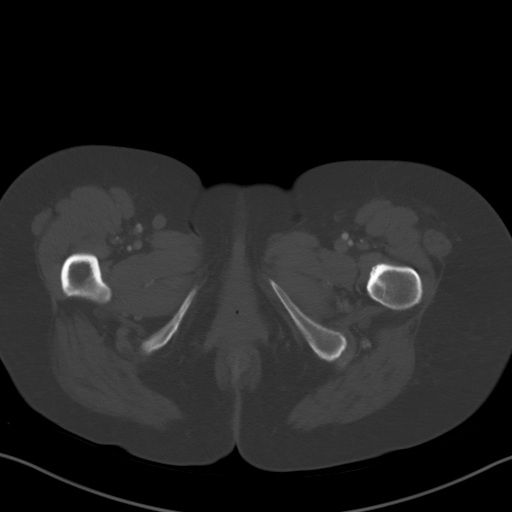
[im 12/87  soft-tissue]
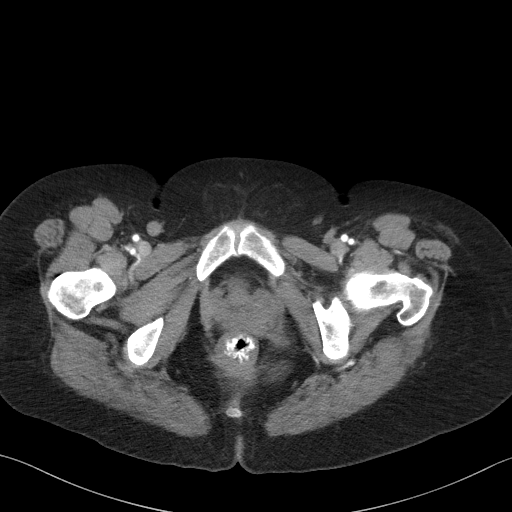
[im 18/87  soft-tissue]
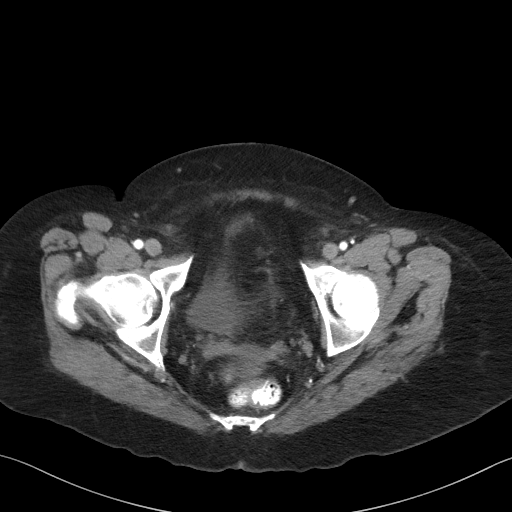
[im 29/87  soft-tissue]
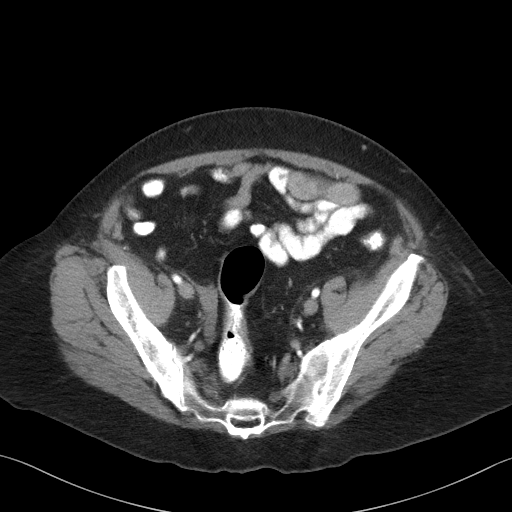
[im 35/87  soft-tissue]
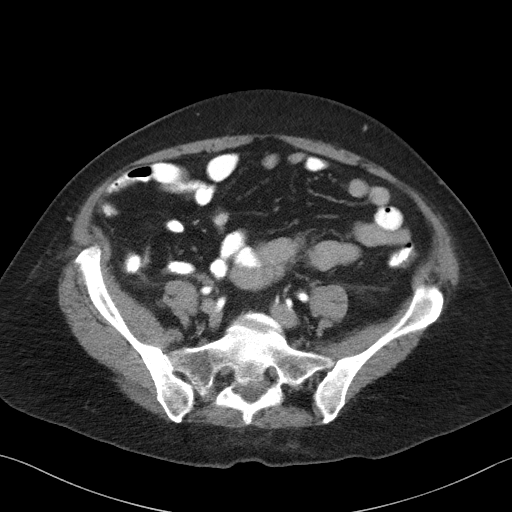
[im 41/87  soft-tissue]
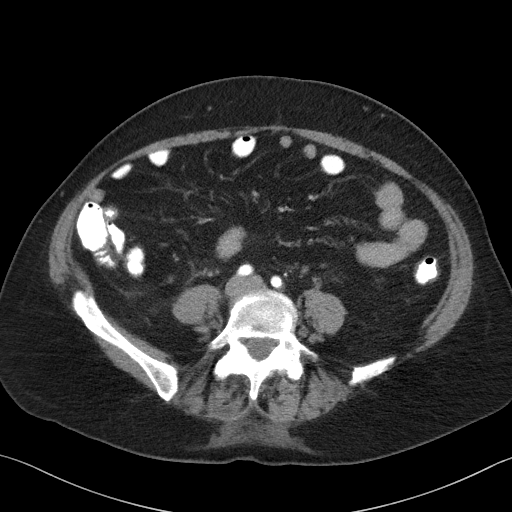
[im 46/87  soft-tissue]
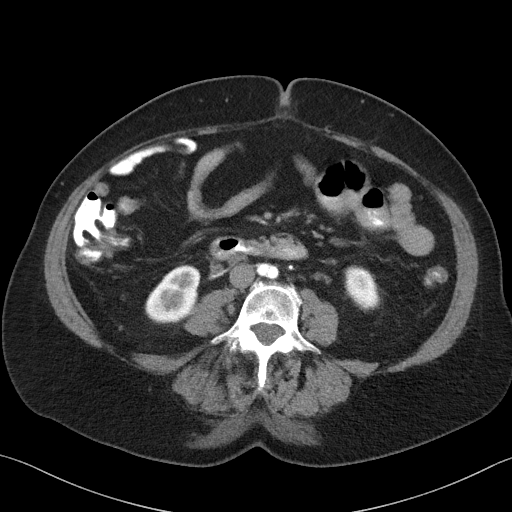
[im 52/87  soft-tissue]
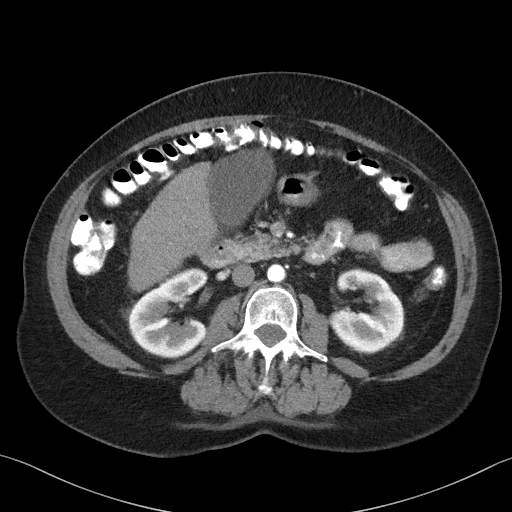
[im 58/87  soft-tissue]
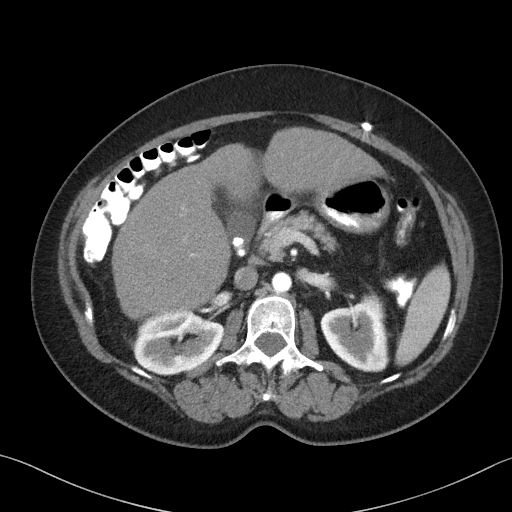
[im 58/87  bone]
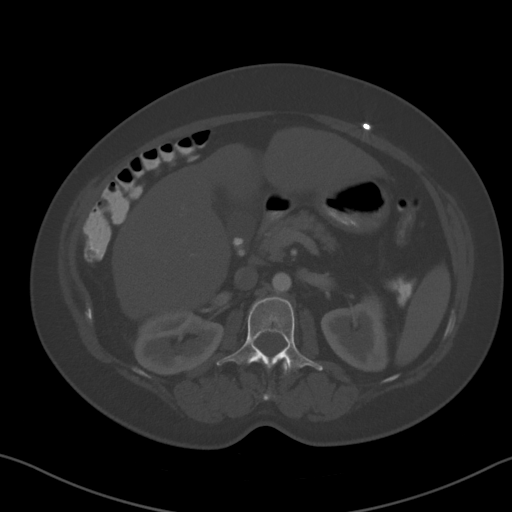
[im 69/87  soft-tissue]
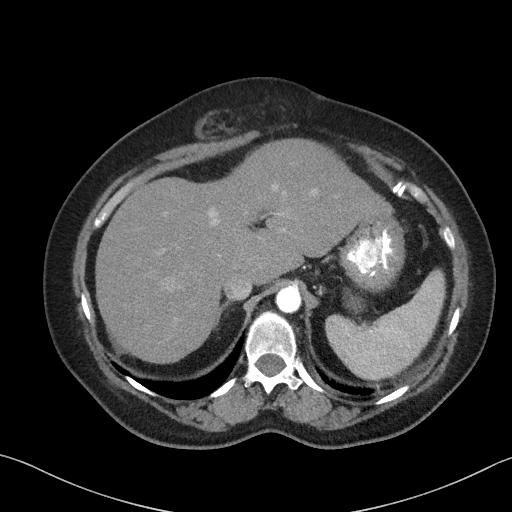
[im 75/87  soft-tissue]
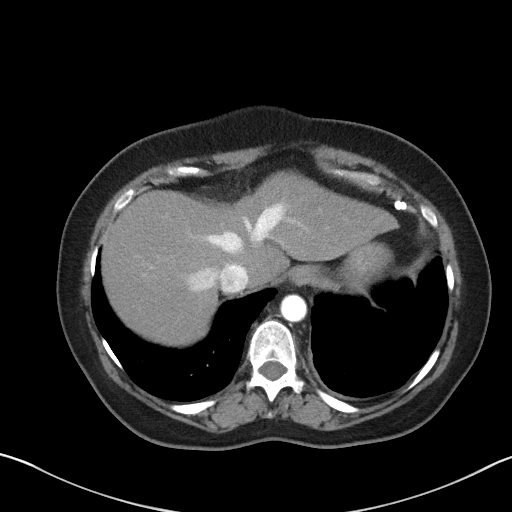
[im 81/87  soft-tissue]
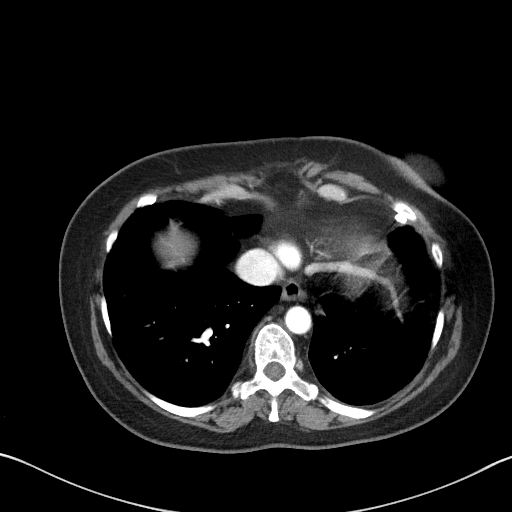

[Series 4: coronal st · coronal · 0.71mm/px · 3 of 96 slices shown]
[im 32/96  soft-tissue]
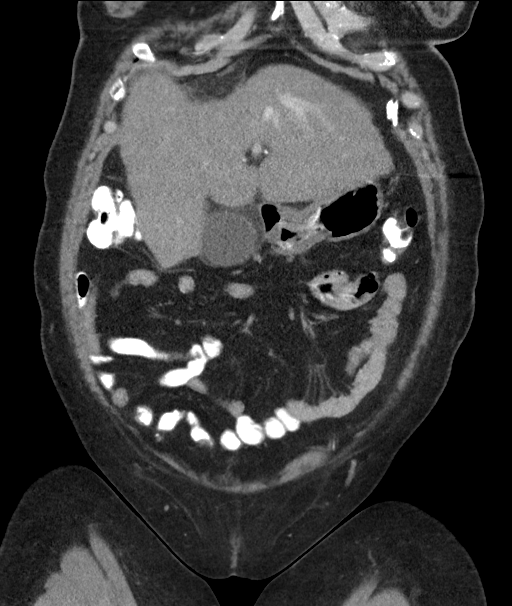
[im 43/96  soft-tissue]
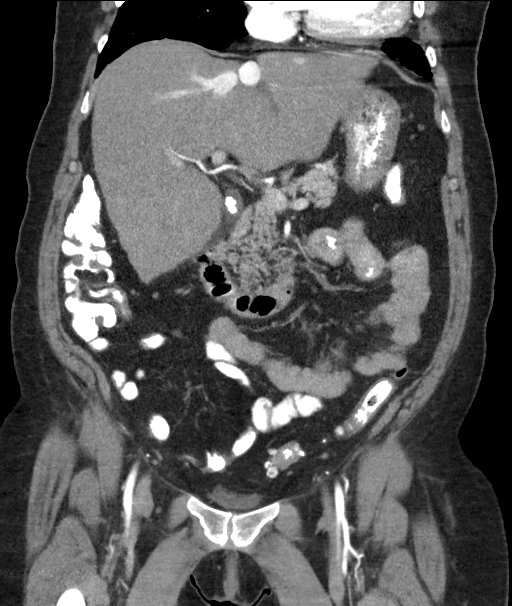
[im 53/96  soft-tissue]
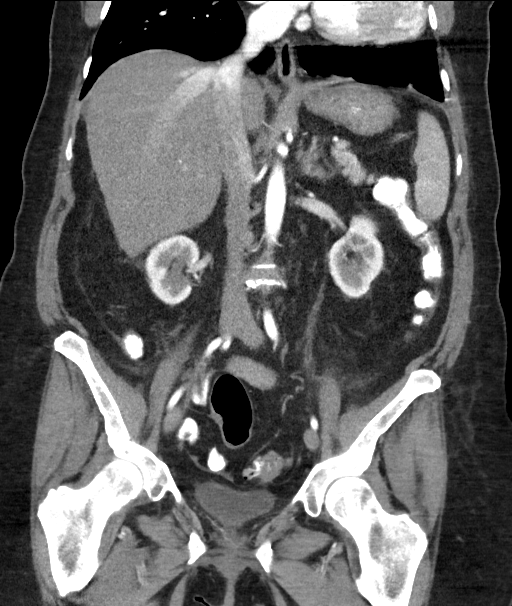

[15 of 46 positions shown; findings below may reference images not displayed]

FINDINGS: Lower chest: Unremarkable

Hepatobiliary: No suspicious focal abnormality within the liver
parenchyma. Asymmetric enlargement lateral segment left liver with
nodular hepatic contour suggests cirrhosis. Gallbladder is distended
with multiple small calcified stone seen in the neck of the
gallbladder. There is pericholecystic fluid/edema. No intrahepatic
or extrahepatic biliary dilation.

Pancreas: No focal mass lesion. No dilatation of the main duct. No
intraparenchymal cyst. No peripancreatic edema.

Spleen: No splenomegaly. No focal mass lesion.

Adrenals/Urinary Tract: No adrenal nodule or mass. 2 mm
nonobstructing stone identified interpolar right kidney. 8 mm
low-density lesion lower pole right kidney is too small to
characterize but stable in the interval. Left kidney unremarkable.
No evidence for hydroureter. The urinary bladder appears normal for
the degree of distention.

Stomach/Bowel: Stomach is unremarkable. No gastric wall thickening.
No evidence of outlet obstruction. Duodenum is normally positioned
as is the ligament of Treitz. No small bowel wall thickening. No
small bowel dilatation. The terminal ileum is normal.
Nonvisualization of the appendix is consistent with the reported
history of appendectomy. No gross colonic mass. No colonic wall
thickening. Diverticular changes are noted in the left colon without
evidence of diverticulitis.

Vascular/Lymphatic: No abdominal aortic aneurysm. No abdominal
aortic atherosclerotic calcification. There is no gastrohepatic or
hepatoduodenal ligament lymphadenopathy. No retroperitoneal or
mesenteric lymphadenopathy. No pelvic sidewall lymphadenopathy.

Reproductive: The uterus is surgically absent. There is no adnexal
mass.

Other: Small volume free fluid noted in the cul-de-sac.

Musculoskeletal: No worrisome lytic or sclerotic osseous
abnormality. Midline epigastric ventral hernia contains fat and a
trace amount of fluid. Hernia sac measures 6.4 x 2.1 x 3.0 cm and is
directed over the right.
IMPRESSION: 1. Distended gallbladder with multiple small calcified stones in the
neck of the gallbladder. There is pericholecystic fluid/edema. No
intrahepatic or extrahepatic biliary duct dilatation. Imaging
features raise concern for acute cholecystitis. Right upper quadrant
ultrasound may prove helpful to further evaluate.
2. Cirrhotic morphology of the liver.
3. Small volume free fluid in the cul-de-sac.
4. Midline epigastric ventral hernia contains fat and a trace amount
of fluid.
5. Left colonic diverticulosis without diverticulitis.
6. 2 mm nonobstructing right renal stone.

These results will be called to the ordering clinician or
representative by the Radiologist Assistant, and communication
documented in the PACS or [REDACTED].

## 2022-04-10 ENCOUNTER — Ambulatory Visit (HOSPITAL_BASED_OUTPATIENT_CLINIC_OR_DEPARTMENT_OTHER): Payer: 59

## 2022-04-13 ENCOUNTER — Ambulatory Visit (HOSPITAL_COMMUNITY)
Admission: RE | Admit: 2022-04-13 | Discharge: 2022-04-13 | Disposition: A | Discharge status: Home / Self Care | Payer: 59 | Source: Ambulatory Visit | Attending: Gastroenterology | Admitting: Gastroenterology

## 2022-04-13 DIAGNOSIS — K8020 Calculus of gallbladder without cholecystitis without obstruction: Secondary | ICD-10-CM | POA: Diagnosis not present

## 2022-04-13 DIAGNOSIS — K7689 Other specified diseases of liver: Secondary | ICD-10-CM | POA: Diagnosis not present

## 2022-04-13 DIAGNOSIS — R932 Abnormal findings on diagnostic imaging of liver and biliary tract: Secondary | ICD-10-CM

## 2022-04-13 DIAGNOSIS — E1169 Type 2 diabetes mellitus with other specified complication: Secondary | ICD-10-CM

## 2022-04-13 DIAGNOSIS — K7460 Unspecified cirrhosis of liver: Secondary | ICD-10-CM | POA: Diagnosis not present

## 2022-04-13 DIAGNOSIS — K769 Liver disease, unspecified: Secondary | ICD-10-CM | POA: Diagnosis not present

## 2022-04-13 DIAGNOSIS — N200 Calculus of kidney: Secondary | ICD-10-CM | POA: Diagnosis not present

## 2022-04-13 LAB — POCT I-STAT CREATININE: Creatinine, Ser: 0.9 mg/dL (ref 0.44–1.00)

## 2022-04-13 MED ORDER — IOHEXOL 300 MG/ML  SOLN
100.0000 mL | Freq: Once | INTRAMUSCULAR | Status: AC | PRN
  Administered 2022-04-13: 100 mL via INTRAVENOUS

## 2022-04-14 ENCOUNTER — Other Ambulatory Visit: Payer: Self-pay

## 2022-04-14 DIAGNOSIS — R932 Abnormal findings on diagnostic imaging of liver and biliary tract: Secondary | ICD-10-CM

## 2022-04-14 DIAGNOSIS — K769 Liver disease, unspecified: Secondary | ICD-10-CM

## 2022-05-16 DIAGNOSIS — Z950 Presence of cardiac pacemaker: Secondary | ICD-10-CM | POA: Diagnosis not present

## 2022-05-25 DIAGNOSIS — Z950 Presence of cardiac pacemaker: Secondary | ICD-10-CM | POA: Diagnosis not present

## 2022-06-28 ENCOUNTER — Encounter: Payer: Self-pay | Admitting: *Deleted

## 2022-06-29 DIAGNOSIS — Q203 Discordant ventriculoarterial connection: Secondary | ICD-10-CM | POA: Diagnosis not present

## 2022-06-29 DIAGNOSIS — Q208 Other congenital malformations of cardiac chambers and connections: Secondary | ICD-10-CM | POA: Diagnosis not present

## 2022-06-29 DIAGNOSIS — I480 Paroxysmal atrial fibrillation: Secondary | ICD-10-CM | POA: Diagnosis not present

## 2022-06-29 DIAGNOSIS — Z8774 Personal history of (corrected) congenital malformations of heart and circulatory system: Secondary | ICD-10-CM | POA: Diagnosis not present

## 2022-06-29 DIAGNOSIS — I5022 Chronic systolic (congestive) heart failure: Secondary | ICD-10-CM | POA: Diagnosis not present

## 2022-07-28 ENCOUNTER — Other Ambulatory Visit: Payer: Self-pay | Admitting: Internal Medicine

## 2022-07-28 DIAGNOSIS — E782 Mixed hyperlipidemia: Secondary | ICD-10-CM

## 2022-08-03 ENCOUNTER — Encounter: Payer: Self-pay | Admitting: Internal Medicine

## 2022-08-03 ENCOUNTER — Ambulatory Visit (INDEPENDENT_AMBULATORY_CARE_PROVIDER_SITE_OTHER): Payer: 59 | Admitting: Internal Medicine

## 2022-08-03 ENCOUNTER — Telehealth: Payer: Self-pay

## 2022-08-03 ENCOUNTER — Other Ambulatory Visit: Payer: Self-pay | Admitting: Internal Medicine

## 2022-08-03 VITALS — BP 124/78 | HR 85 | Resp 18 | Ht 62.0 in | Wt 130.0 lb

## 2022-08-03 DIAGNOSIS — I10 Essential (primary) hypertension: Secondary | ICD-10-CM

## 2022-08-03 DIAGNOSIS — Z79899 Other long term (current) drug therapy: Secondary | ICD-10-CM | POA: Diagnosis not present

## 2022-08-03 DIAGNOSIS — E1169 Type 2 diabetes mellitus with other specified complication: Secondary | ICD-10-CM

## 2022-08-03 DIAGNOSIS — Z0279 Encounter for issue of other medical certificate: Secondary | ICD-10-CM

## 2022-08-03 DIAGNOSIS — E559 Vitamin D deficiency, unspecified: Secondary | ICD-10-CM | POA: Diagnosis not present

## 2022-08-03 DIAGNOSIS — I480 Paroxysmal atrial fibrillation: Secondary | ICD-10-CM | POA: Diagnosis not present

## 2022-08-03 DIAGNOSIS — E782 Mixed hyperlipidemia: Secondary | ICD-10-CM

## 2022-08-03 DIAGNOSIS — R69 Illness, unspecified: Secondary | ICD-10-CM | POA: Diagnosis not present

## 2022-08-03 DIAGNOSIS — Z5181 Encounter for therapeutic drug level monitoring: Secondary | ICD-10-CM | POA: Diagnosis not present

## 2022-08-03 DIAGNOSIS — Z0001 Encounter for general adult medical examination with abnormal findings: Secondary | ICD-10-CM

## 2022-08-03 DIAGNOSIS — F411 Generalized anxiety disorder: Secondary | ICD-10-CM

## 2022-08-03 NOTE — Assessment & Plan Note (Signed)
BP Readings from Last 1 Encounters:  08/03/22 124/78   Well-controlled with Metoprolol and Spironolactone Counseled for compliance with the medications Advised DASH diet and moderate exercise/walking, at least 150 mins/week

## 2022-08-03 NOTE — Assessment & Plan Note (Signed)
Likely work-related stress May get [facility] therapy later Discussed about taking few days off/break, she will think about it and bring necessary paperwork

## 2022-08-03 NOTE — Patient Instructions (Signed)
Please continue to take medications as prescribed. ? ?Please continue to follow low carb diet and perform moderate exercise/walking at least 150 mins/week. ?

## 2022-08-03 NOTE — Assessment & Plan Note (Signed)
S/p Cardioversion in 02/2020 On Metoprolol, Digoxin and Coumadin

## 2022-08-03 NOTE — Assessment & Plan Note (Signed)

## 2022-08-03 NOTE — Telephone Encounter (Signed)
FMLA (leave of absence) forms  Copied Noted Sleeved

## 2022-08-03 NOTE — Assessment & Plan Note (Signed)
Check HBA1C On Jardiance 10 mg QD Advised to follow diabetic diet On statin and Spironolactone F/u CMP and lipid panel Diabetic eye exam: Advised to follow up with Ophthalmology for diabetic eye exam

## 2022-08-03 NOTE — Progress Notes (Signed)
Established Patient Office Visit  Subjective:  Patient ID: Brandi Fischer, female    DOB: [birth year]  Age: 56 y.o. MRN: [id number]  CC:  Chief Complaint  Patient presents with   Annual Exam    Annual exam pt would like to discuss fmla for job she is really anxious     HPI Brandi Fischer is a 56 y.o. 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  Type 2 DM with HLD: She has been placed on Jardiance for DM and her cardiac conditions now. She has been taking Crestor.  She denies any fatigue, polyuria or polyphagia currently.   CHF: She has been seeing Dr Norman Clay for her congenital heart defect. Denies any headache, dizziness, chest pain, dyspnea or palpitations.   Atrial flutter: Followed by Dr. Rosita Fire.  She is on Toprol and Eliquis currently.  Denies any easy bruising or active bleeding.  GAD: She has been experiencing work-related stress lately.  She has been feeling overwhelmed at times.  She has had spells of severe anxiety, but denies any anhedonia, SI or HI currently.    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 grade   Unspecified chronic conjunctivitis, left eye     Past Surgical History:  Procedure Laterality Date   ABDOMINAL HYSTERECTOMY     APPENDECTOMY  10/2010   cardiac pacemaker procedure     CARDIAC SURGERY     TUBAL LIGATION      Family History  Problem Relation Age of Onset   Stroke Father    Diabetes Maternal Grandfather    Diabetes Paternal Grandmother    Breast cancer Paternal Grandmother    Breast  cancer Paternal Aunt    Colon cancer Neg Hx    Esophageal cancer Neg Hx    Rectal cancer Neg Hx    Stomach cancer Neg Hx     Social History   Socioeconomic History   Marital status: Married    Spouse name: Not on file   Number of children: Not on file   Years of education: Not on file   Highest education level: Not on file  Occupational History   Not on file  Tobacco Use   Smoking status: Never   Smokeless tobacco: Never  Vaping Use   Vaping Use: Never used  Substance and Sexual Activity   Alcohol use: No   Drug use: No   Sexual activity: Not on file  Other Topics Concern   Not on file  Social History Narrative   Not on file   Social Determinants of Health   Financial Resource Strain: Not on file  Food Insecurity: Not on file  Transportation Needs: Not on file  Physical Activity: Not on file  Stress: Not on file  Social Connections: Not on file  Intimate Partner Violence: Not on file    Outpatient Medications Prior to Visit  Medication Sig Dispense Refill   empagliflozin (JARDIANCE) 10 MG TABS tablet Take 10 mg by mouth daily.     spironolactone (ALDACTONE)  25 MG tablet Take 25 mg by mouth daily.     calcium-vitamin D (OSCAL WITH D) 500-200 MG-UNIT per tablet Take 1 tablet by mouth.     ELIQUIS 5 MG TABS tablet Take 5 mg by mouth 2 (two) times daily.     furosemide (LASIX) 40 MG tablet TAKE 1 TABLET(40 MG) BY MOUTH DAILY (Patient taking differently: 40 mg. Taking it every other day, not daily.) 90 tablet 1   Magnesium (V-R MAGNESIUM) 250 MG TABS Take 250 mg by mouth daily.      metoprolol succinate (TOPROL-XL) 25 MG 24 hr tablet TAKE 1 TABLET(25 MG) BY MOUTH DAILY 90 tablet 1   Multiple Vitamin (MULTIVITAMIN PO) Take 1 tablet by mouth daily.     rosuvastatin (CRESTOR) 5 MG tablet TAKE 1 TABLET(5 MG) BY MOUTH DAILY 90 tablet 1   amoxicillin-clavulanate (AUGMENTIN) 875-125 MG tablet Take 1 tablet by mouth 2 (two) times daily. (Patient not taking: Reported on  08/03/2022) 14 tablet 0   benzonatate (TESSALON) 100 MG capsule Take 1 capsule (100 mg total) by mouth every 8 (eight) hours. (Patient not taking: Reported on 08/03/2022) 21 capsule 0   digoxin (LANOXIN) 0.125 MG tablet TAKE 1 TABLET(0.125 MG) BY MOUTH DAILY 30 tablet 3   spironolactone (ALDACTONE) 25 MG tablet TAKE 1/2 TABLET BY MOUTH DAILY (Patient taking differently: Take 25 mg by mouth once.) 90 tablet 1   warfarin (COUMADIN) 5 MG tablet Take 3.5 mg by mouth See admin instructions. Take 3.5 mg by mouth every day except Sunday - 5 mg (Patient not taking: Reported on 08/03/2022)     Facility-Administered Medications Prior to Visit  Medication Dose Route Frequency Provider Last Rate Last Admin   0.9 %  sodium chloride infusion  500 mL Intravenous Once Ladene Artist, MD        Allergies  Allergen Reactions   Shellfish Allergy     Hives    ROS Review of Systems  Constitutional:  Negative for chills and fever.  HENT:  Negative for congestion, sinus pressure, sinus pain and sore throat.   Eyes:  Negative for pain and discharge.  Respiratory:  Negative for cough and shortness of breath.   Cardiovascular:  Negative for chest pain, palpitations and leg swelling.  Gastrointestinal:  Negative for abdominal pain, constipation, diarrhea, nausea and vomiting.  Endocrine: Negative for polydipsia and polyuria.  Genitourinary:  Negative for dysuria and hematuria.  Musculoskeletal:  Negative for neck pain and neck stiffness.  Skin:  Negative for rash.  Neurological:  Negative for dizziness, syncope, weakness and numbness.  Psychiatric/Behavioral:  Negative for agitation and behavioral problems. The patient is nervous/anxious.       Objective:    Physical Exam Vitals reviewed.  Constitutional:      General: She is not in acute distress.    Appearance: She is not diaphoretic.  HENT:     Head: Normocephalic and atraumatic.     Nose: Nose normal. No congestion.     Mouth/Throat:      Mouth: Mucous membranes are moist.     Pharynx: No posterior oropharyngeal erythema.  Eyes:     General: No scleral icterus.    Extraocular Movements: Extraocular movements intact.  Cardiovascular:     Rate and Rhythm: Normal rate and regular rhythm.     Pulses: Normal pulses.     Heart sounds: Murmur (Systolic (pronounced in left upper sternal border)) heard.  Pulmonary:     Breath sounds: Normal breath sounds. No wheezing  or rales.  Abdominal:     Palpations: Abdomen is soft.     Tenderness: There is no abdominal tenderness.  Musculoskeletal:     Cervical back: Neck supple. No rigidity or tenderness.     Right lower leg: No edema.     Left lower leg: No edema.  Skin:    General: Skin is warm.     Findings: No rash.  Neurological:     General: No focal deficit present.     Mental Status: She is alert and oriented to person, place, and time.     Cranial Nerves: No cranial nerve deficit.     Sensory: No sensory deficit.     Motor: No weakness.  Psychiatric:        Mood and Affect: Mood normal.        Behavior: Behavior normal.     BP 124/78 (BP Location: Right Arm, Patient Position: Sitting, Cuff Size: Normal)   Pulse 85   Resp 18   Ht _0  (1.575 m)   Wt 130 lb (59 kg)   SpO2 100%   BMI 23.78 kg/m  Wt Readings from Last 3 Encounters:  08/03/22 130 lb (59 kg)  01/21/22 130 lb 3.2 oz (59.1 kg)  07/23/21 135 lb 1.3 oz (61.3 kg)    Lab Results  Component Value Date   TSH 3.550 07/23/2021   Lab Results  Component Value Date   WBC 6.7 02/15/2022   HGB 14.7 02/15/2022   HCT 43.5 02/15/2022   MCV 92.9 02/15/2022   PLT 123.0 (L) 02/15/2022   Lab Results  Component Value Date   NA 138 01/22/2022   K 3.9 01/22/2022   CO2 23 01/22/2022   GLUCOSE 145 (H) 01/22/2022   BUN 18 01/22/2022   CREATININE 0.90 04/13/2022   BILITOT 0.8 07/23/2021   ALKPHOS 99 07/23/2021   AST 25 07/23/2021   ALT 22 07/23/2021   PROT 7.9 07/23/2021   ALBUMIN 5.2 (H) 07/23/2021    CALCIUM 9.1 01/22/2022   ANIONGAP 8 07/02/2020   EGFR 84 01/22/2022   Lab Results  Component Value Date   CHOL 127 01/22/2022   Lab Results  Component Value Date   HDL 36 (L) 01/22/2022   Lab Results  Component Value Date   LDLCALC 63 01/22/2022   Lab Results  Component Value Date   TRIG 161 (H) 01/22/2022   Lab Results  Component Value Date   CHOLHDL 3.5 01/22/2022   Lab Results  Component Value Date   HGBA1C 6.8 (H) 01/22/2022      Assessment & Plan:   Problem List Items Addressed This Visit       Cardiovascular and Mediastinum   Essential hypertension, benign    BP Readings from Last 1 Encounters:  08/03/22 124/78  Well-controlled with Metoprolol and Spironolactone Counseled for compliance with the medications Advised DASH diet and moderate exercise/walking, at least 150 mins/week      Relevant Medications   ELIQUIS 5 MG TABS tablet   spironolactone (ALDACTONE) 25 MG tablet   Other Relevant Orders   CMP14+EGFR   CBC with Differential/Platelet   Paroxysmal atrial fibrillation ([place])    S/p Cardioversion in 02/2020 On Metoprolol, Digoxin and Coumadin      Relevant Medications   ELIQUIS 5 MG TABS tablet   spironolactone (ALDACTONE) 25 MG tablet   Other Relevant Orders   TSH     Endocrine   Diabetes mellitus (HCC)    Check HBA1C On Jardiance 10  mg QD Advised to follow diabetic diet On statin and Spironolactone F/u CMP and lipid panel Diabetic eye exam: Advised to follow up with Ophthalmology for diabetic eye exam      Relevant Medications   empagliflozin (JARDIANCE) 10 MG TABS tablet   Other Relevant Orders   Hemoglobin A1c   CMP14+EGFR     Other   Mixed hyperlipidemia   Relevant Medications   ELIQUIS 5 MG TABS tablet   spironolactone (ALDACTONE) 25 MG tablet   Other Relevant Orders   Lipid panel   Encounter for general adult medical examination with abnormal findings - Primary    Physical exam as documented. Counseling done  re  healthy lifestyle involving commitment to 150 minutes exercise per week, heart healthy diet, and attaining healthy weight.The importance of adequate sleep also discussed. Changes in health habits are decided on by the patient with goals and time frames  set for achieving them. Immunization and cancer screening needs are specifically addressed at this visit.      Relevant Orders   CMP14+EGFR   CBC with Differential/Platelet   GAD (generalized anxiety disorder)    Likely work-related stress May get [facility] therapy later Discussed about taking few days off/break, she will think about it and bring necessary paperwork      Relevant Orders   TSH   Other Visit Diagnoses     Encounter for monitoring digoxin therapy       Relevant Orders   Digoxin level   Vitamin D deficiency       Relevant Orders   VITAMIN D 25 Hydroxy (Vit-D Deficiency, Fractures)       No orders of the defined types were placed in this encounter.   Follow-up: Return in about 6 months (around 02/01/2023).    Lindell Spar, MD

## 2022-08-04 NOTE — Telephone Encounter (Signed)
Spouse picked up the forms

## 2022-08-05 ENCOUNTER — Telehealth: Payer: Self-pay | Admitting: Internal Medicine

## 2022-08-05 LAB — CBC WITH DIFFERENTIAL/PLATELET
Basophils Absolute: 0.1 10*3/uL (ref 0.0–0.2)
Basos: 1 %
EOS (ABSOLUTE): 0.2 10*3/uL (ref 0.0–0.4)
Eos: 3 %
Hematocrit: 46.9 % — ABNORMAL HIGH (ref 34.0–46.6)
Hemoglobin: 15.5 g/dL (ref 11.1–15.9)
Immature Grans (Abs): 0 10*3/uL (ref 0.0–0.1)
Immature Granulocytes: 0 %
Lymphocytes Absolute: 1.4 10*3/uL (ref 0.7–3.1)
Lymphs: 20 %
MCH: 31.2 pg (ref 26.6–33.0)
MCHC: 33 g/dL (ref 31.5–35.7)
MCV: 94 fL (ref 79–97)
Monocytes Absolute: 0.5 10*3/uL (ref 0.1–0.9)
Monocytes: 7 %
Neutrophils Absolute: 4.9 10*3/uL (ref 1.4–7.0)
Neutrophils: 69 %
Platelets: 121 10*3/uL — ABNORMAL LOW (ref 150–450)
RBC: 4.97 x10E6/uL (ref 3.77–5.28)
RDW: 13 % (ref 11.7–15.4)
WBC: 7.2 10*3/uL (ref 3.4–10.8)

## 2022-08-05 LAB — CMP14+EGFR
ALT: 19 IU/L (ref 0–32)
AST: 21 IU/L (ref 0–40)
Albumin/Globulin Ratio: 2.2 (ref 1.2–2.2)
Albumin: 5.3 g/dL — ABNORMAL HIGH (ref 3.8–4.9)
Alkaline Phosphatase: 91 IU/L (ref 44–121)
BUN/Creatinine Ratio: 18 (ref 9–23)
BUN: 17 mg/dL (ref 6–24)
Bilirubin Total: 0.5 mg/dL (ref 0.0–1.2)
CO2: 19 mmol/L — ABNORMAL LOW (ref 20–29)
Calcium: 9.7 mg/dL (ref 8.7–10.2)
Chloride: 105 mmol/L (ref 96–106)
Creatinine, Ser: 0.94 mg/dL (ref 0.57–1.00)
Globulin, Total: 2.4 g/dL (ref 1.5–4.5)
Glucose: 135 mg/dL — ABNORMAL HIGH (ref 70–99)
Potassium: 4.5 mmol/L (ref 3.5–5.2)
Sodium: 146 mmol/L — ABNORMAL HIGH (ref 134–144)
Total Protein: 7.7 g/dL (ref 6.0–8.5)
eGFR: 71 mL/min/{1.73_m2} (ref >59)

## 2022-08-05 LAB — LIPID PANEL
Chol/HDL Ratio: 3.6 ratio (ref 0.0–4.4)
Cholesterol, Total: 133 mg/dL (ref 100–199)
HDL: 37 mg/dL — ABNORMAL LOW (ref >39)
LDL Chol Calc (NIH): 67 mg/dL (ref 0–99)
Triglycerides: 169 mg/dL — ABNORMAL HIGH (ref 0–149)
VLDL Cholesterol Cal: 29 mg/dL (ref 5–40)

## 2022-08-05 LAB — TSH: TSH: 3.13 u[IU]/mL (ref 0.450–4.500)

## 2022-08-05 LAB — HEMOGLOBIN A1C
Est. average glucose Bld gHb Est-mCnc: 148 mg/dL
Hgb A1c MFr Bld: 6.8 % — ABNORMAL HIGH (ref 4.8–5.6)

## 2022-08-05 LAB — VITAMIN D 25 HYDROXY (VIT D DEFICIENCY, FRACTURES): Vit D, 25-Hydroxy: 28.4 ng/mL — ABNORMAL LOW (ref 30.0–100.0)

## 2022-08-05 LAB — DIGOXIN LEVEL: Digoxin, Serum: 1.1 ng/mL — ABNORMAL HIGH (ref 0.5–0.9)

## 2022-08-05 NOTE — Telephone Encounter (Signed)
Pt advised with verbal understanding  °

## 2022-08-05 NOTE — Telephone Encounter (Signed)
Patient returning call for lab results. 

## 2022-08-11 ENCOUNTER — Other Ambulatory Visit: Payer: Self-pay | Admitting: Internal Medicine

## 2022-08-11 DIAGNOSIS — R011 Cardiac murmur, unspecified: Secondary | ICD-10-CM

## 2022-08-11 DIAGNOSIS — I10 Essential (primary) hypertension: Secondary | ICD-10-CM

## 2022-08-12 ENCOUNTER — Telehealth: Payer: Self-pay | Admitting: Internal Medicine

## 2022-08-12 NOTE — Telephone Encounter (Signed)
Attending physician statement   Needs to be completed based off of what was previously done. Patient oow date 08/16/2022.     Noted, copied, sleeved & given to provider

## 2022-08-12 NOTE — Telephone Encounter (Signed)
Unable to reach anyone or leave voicemail.

## 2022-08-12 NOTE — Telephone Encounter (Signed)
Brandi Fischer, [phone]     Called needing clinical info about pt in regards to short-term disability info. He is faxing some info but needs verbal as well.

## 2022-08-13 ENCOUNTER — Telehealth: Payer: Self-pay | Admitting: Internal Medicine

## 2022-08-13 NOTE — Telephone Encounter (Signed)
FMLA  Copied Noted Sleeved  Per patient date of leave changed to 11.13.2023 due to circumstances at work and felt that patient was needed the week of 11.06.2023-11.10.2023. an

## 2022-08-16 ENCOUNTER — Telehealth: Payer: Self-pay | Admitting: Internal Medicine

## 2022-08-16 NOTE — Telephone Encounter (Signed)
Diusability forms   Noted  Copied  Sleeved   In brown folders

## 2022-08-28 ENCOUNTER — Other Ambulatory Visit: Payer: Self-pay | Admitting: Internal Medicine

## 2022-08-28 DIAGNOSIS — R600 Localized edema: Secondary | ICD-10-CM

## 2022-08-30 DIAGNOSIS — Z0279 Encounter for issue of other medical certificate: Secondary | ICD-10-CM

## 2022-08-31 ENCOUNTER — Encounter: Payer: Self-pay | Admitting: Internal Medicine

## 2022-08-31 ENCOUNTER — Ambulatory Visit (INDEPENDENT_AMBULATORY_CARE_PROVIDER_SITE_OTHER): Payer: 59 | Admitting: Internal Medicine

## 2022-08-31 VITALS — BP 94/61 | HR 82 | Ht 62.0 in | Wt 127.8 lb

## 2022-08-31 DIAGNOSIS — I10 Essential (primary) hypertension: Secondary | ICD-10-CM

## 2022-08-31 DIAGNOSIS — F411 Generalized anxiety disorder: Secondary | ICD-10-CM | POA: Diagnosis not present

## 2022-08-31 DIAGNOSIS — I480 Paroxysmal atrial fibrillation: Secondary | ICD-10-CM

## 2022-08-31 DIAGNOSIS — R69 Illness, unspecified: Secondary | ICD-10-CM | POA: Diagnosis not present

## 2022-08-31 NOTE — Assessment & Plan Note (Signed)
BP Readings from Last 1 Encounters:  08/31/22 94/61   Well-controlled with Metoprolol and Spironolactone Counseled for compliance with the medications Advised DASH diet and moderate exercise/walking, at least 150 mins/week

## 2022-08-31 NOTE — Assessment & Plan Note (Addendum)
Likely work-related stress May get [facility] therapy later from her workplace Avoid any anxiolytic for now to avoid interaction with her cardiac medications FMLA paperwork submitted

## 2022-08-31 NOTE — Progress Notes (Signed)
Established Patient Office Visit  Subjective:  Patient ID: Brandi Fischer, female    DOB: [birth year]  Age: 56 y.o. MRN: [id number]  CC:  Chief Complaint  Patient presents with   Follow-up    FMLA forms to be completed no new concerns    HPI Brandi Fischer is a 56 y.o. 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  GAD: She has been experiencing work-related stress lately.  She has been feeling overwhelmed at times.  She has had spells of severe anxiety, but denies any anhedonia, SI or HI currently.  Due to work related stress, she has had labile blood pressure and palpitations.  She has felt slightly better for the last 2 weeks since being off of work.   CHF: She has been seeing Dr Norman Clay for her congenital heart defect. Denies any headache, dizziness, chest pain, dyspnea or palpitations.   Atrial flutter: Followed by Dr. Rosita Fire.  She is on Toprol and Eliquis currently.  Denies any easy bruising or active bleeding.  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 grade   Unspecified chronic conjunctivitis, left eye     Past Surgical History:  Procedure Laterality Date   ABDOMINAL HYSTERECTOMY     APPENDECTOMY  10/2010   cardiac pacemaker procedure     CARDIAC SURGERY     TUBAL LIGATION      Family History  Problem Relation Age of Onset   Stroke Father    Diabetes Maternal Grandfather    Diabetes Paternal Grandmother    Breast cancer Paternal Grandmother    Breast cancer Paternal Aunt    Colon  cancer Neg Hx    Esophageal cancer Neg Hx    Rectal cancer Neg Hx    Stomach cancer Neg Hx     Social History   Socioeconomic History   Marital status: Married    Spouse name: Not on file   Number of children: Not on file   Years of education: Not on file   Highest education level: Not on file  Occupational History   Not on file  Tobacco Use   Smoking status: Never   Smokeless tobacco: Never  Vaping Use   Vaping Use: Never used  Substance and Sexual Activity   Alcohol use: No   Drug use: No   Sexual activity: Not on file  Other Topics Concern   Not on file  Social History Narrative   Not on file   Social Determinants of Health   Financial Resource Strain: Not on file  Food Insecurity: Not on file  Transportation Needs: Not on file  Physical Activity: Not on file  Stress: Not on file  Social Connections: Not on file  Intimate Partner Violence: Not on file    Outpatient Medications Prior to Visit  Medication Sig Dispense Refill   calcium-vitamin D (OSCAL WITH D) 500-200 MG-UNIT per tablet Take 1 tablet by mouth.     digoxin (LANOXIN) 0.125 MG tablet TAKE 1  TABLET(0.125 MG) BY MOUTH DAILY 30 tablet 3   ELIQUIS 5 MG TABS tablet Take 5 mg by mouth 2 (two) times daily.     empagliflozin (JARDIANCE) 10 MG TABS tablet Take 10 mg by mouth daily.     furosemide (LASIX) 40 MG tablet TAKE 1 TABLET(40 MG) BY MOUTH DAILY 90 tablet 1   Magnesium (V-R MAGNESIUM) 250 MG TABS Take 250 mg by mouth daily.      metoprolol succinate (TOPROL-XL) 25 MG 24 hr tablet TAKE 1 TABLET(25 MG) BY MOUTH DAILY 90 tablet 1   Multiple Vitamin (MULTIVITAMIN PO) Take 1 tablet by mouth daily.     rosuvastatin (CRESTOR) 5 MG tablet TAKE 1 TABLET(5 MG) BY MOUTH DAILY 90 tablet 1   spironolactone (ALDACTONE) 25 MG tablet Take 25 mg by mouth daily.     Facility-Administered Medications Prior to Visit  Medication Dose Route Frequency Provider Last Rate Last Admin   0.9 %  sodium chloride infusion  500 mL  Intravenous Once Ladene Artist, MD        Allergies  Allergen Reactions   Shellfish Allergy     Hives    ROS Review of Systems  Constitutional:  Negative for chills and fever.  HENT:  Negative for congestion, sinus pressure, sinus pain and sore throat.   Eyes:  Negative for pain and discharge.  Respiratory:  Negative for cough and shortness of breath.   Cardiovascular:  Negative for chest pain, palpitations and leg swelling.  Gastrointestinal:  Negative for abdominal pain, constipation, diarrhea, nausea and vomiting.  Endocrine: Negative for polydipsia and polyuria.  Genitourinary:  Negative for dysuria and hematuria.  Musculoskeletal:  Negative for neck pain and neck stiffness.  Skin:  Negative for rash.  Neurological:  Negative for dizziness, syncope, weakness and numbness.  Psychiatric/Behavioral:  Negative for agitation and behavioral problems. The patient is nervous/anxious.       Objective:    Physical Exam Vitals reviewed.  Constitutional:      General: She is not in acute distress.    Appearance: She is not diaphoretic.  HENT:     Head: Normocephalic and atraumatic.     Nose: Nose normal. No congestion.     Mouth/Throat:     Mouth: Mucous membranes are moist.     Pharynx: No posterior oropharyngeal erythema.  Eyes:     General: No scleral icterus.    Extraocular Movements: Extraocular movements intact.  Cardiovascular:     Rate and Rhythm: Normal rate and regular rhythm.     Pulses: Normal pulses.     Heart sounds: Murmur (Systolic (pronounced in left upper sternal border)) heard.  Pulmonary:     Breath sounds: Normal breath sounds. No wheezing or rales.  Abdominal:     Palpations: Abdomen is soft.     Tenderness: There is no abdominal tenderness.  Musculoskeletal:     Cervical back: Neck supple. No rigidity or tenderness.     Right lower leg: No edema.     Left lower leg: No edema.  Skin:    General: Skin is warm.     Findings: No rash.   Neurological:     General: No focal deficit present.     Mental Status: She is alert and oriented to person, place, and time.     Cranial Nerves: No cranial nerve deficit.     Sensory: No sensory deficit.     Motor: No weakness.  Psychiatric:        Mood  and Affect: Mood normal.        Behavior: Behavior normal.     BP 94/61 (BP Location: Right Arm, Patient Position: Sitting, Cuff Size: Normal)   Pulse 82   Ht _0  (1.575 m)   Wt 127 lb 12.8 oz (58 kg)   SpO2 91%   BMI 23.37 kg/m  Wt Readings from Last 3 Encounters:  08/31/22 127 lb 12.8 oz (58 kg)  08/03/22 130 lb (59 kg)  01/21/22 130 lb 3.2 oz (59.1 kg)    Lab Results  Component Value Date   TSH 3.130 08/03/2022   Lab Results  Component Value Date   WBC 7.2 08/03/2022   HGB 15.5 08/03/2022   HCT 46.9 (H) 08/03/2022   MCV 94 08/03/2022   PLT 121 (L) 08/03/2022   Lab Results  Component Value Date   NA 146 (H) 08/03/2022   K 4.5 08/03/2022   CO2 19 (L) 08/03/2022   GLUCOSE 135 (H) 08/03/2022   BUN 17 08/03/2022   CREATININE 0.94 08/03/2022   BILITOT 0.5 08/03/2022   ALKPHOS 91 08/03/2022   AST 21 08/03/2022   ALT 19 08/03/2022   PROT 7.7 08/03/2022   ALBUMIN 5.3 (H) 08/03/2022   CALCIUM 9.7 08/03/2022   ANIONGAP 8 07/02/2020   EGFR 71 08/03/2022   Lab Results  Component Value Date   CHOL 133 08/03/2022   Lab Results  Component Value Date   HDL 37 (L) 08/03/2022   Lab Results  Component Value Date   LDLCALC 67 08/03/2022   Lab Results  Component Value Date   TRIG 169 (H) 08/03/2022   Lab Results  Component Value Date   CHOLHDL 3.6 08/03/2022   Lab Results  Component Value Date   HGBA1C 6.8 (H) 08/03/2022      Assessment & Plan:   Problem List Items Addressed This Visit       Cardiovascular and Mediastinum   Essential hypertension, benign    BP Readings from Last 1 Encounters:  08/31/22 94/61  Well-controlled with Metoprolol and Spironolactone Counseled for compliance with  the medications Advised DASH diet and moderate exercise/walking, at least 150 mins/week      Paroxysmal atrial fibrillation ([place])    S/p Cardioversion in 02/2020 On Metoprolol, Digoxin and Coumadin        Other   GAD (generalized anxiety disorder) - Primary    Likely work-related stress May get [facility] therapy later from her workplace Avoid any anxiolytic for now to avoid interaction with her cardiac medications FMLA paperwork submitted       No orders of the defined types were placed in this encounter.   Follow-up: Return if symptoms worsen or fail to improve.    Lindell Spar, MD

## 2022-08-31 NOTE — Patient Instructions (Signed)
Please continue to take medications as prescribed.  Please contact counselor at work for behavioral health therapy sessions.

## 2022-08-31 NOTE — Assessment & Plan Note (Signed)
S/p Cardioversion in 02/2020 On Metoprolol, Digoxin and Coumadin

## 2022-09-01 NOTE — Telephone Encounter (Signed)
Patient picked up forms.

## 2022-09-22 ENCOUNTER — Encounter: Payer: Self-pay | Admitting: Internal Medicine

## 2022-09-22 ENCOUNTER — Ambulatory Visit (INDEPENDENT_AMBULATORY_CARE_PROVIDER_SITE_OTHER): Payer: 59 | Admitting: Internal Medicine

## 2022-09-22 VITALS — BP 128/77 | HR 84 | Ht 62.0 in | Wt 126.8 lb

## 2022-09-22 DIAGNOSIS — R062 Wheezing: Secondary | ICD-10-CM | POA: Diagnosis not present

## 2022-09-22 DIAGNOSIS — F411 Generalized anxiety disorder: Secondary | ICD-10-CM

## 2022-09-22 DIAGNOSIS — R69 Illness, unspecified: Secondary | ICD-10-CM | POA: Diagnosis not present

## 2022-09-22 MED ORDER — LEVALBUTEROL TARTRATE 45 MCG/ACT IN AERO: 1.0000 | INHALATION_SPRAY | Freq: Four times a day (QID) | RESPIRATORY_TRACT | 12 refills | Status: AC | PRN

## 2022-09-22 NOTE — Patient Instructions (Signed)
Please continue to take medications as prescribed.

## 2022-09-23 ENCOUNTER — Telehealth: Payer: Self-pay | Admitting: Internal Medicine

## 2022-09-23 NOTE — Assessment & Plan Note (Signed)
Likely work-related stress Had get [facility] therapy from her workplace Referred to [facility] therapy at [facility] clinic Avoid any anxiolytic for now to avoid interaction with her cardiac medications FMLA paperwork submitted

## 2022-09-23 NOTE — Progress Notes (Signed)
Established Patient Office Visit  Subjective:  Patient ID: Brandi Fischer, female    DOB: 10/14/1965  Age: 56 y.o. MRN: [id number]  CC:  Chief Complaint  Patient presents with   Follow-up    Patient following up from being out of work, she would like for her out of work to be extended. Patient also having drainage in her ears and throat     HPI Brandi Fischer is a 56 y.o. 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  GAD: She had been experiencing work-related stress.  She had been feeling overwhelmed at times.  She has had spells of severe anxiety, but denies any anhedonia, SI or HI currently.  Due to work related stress, she has had labile blood pressure and palpitations.  She has felt slightly better for the last 6 weeks since being off of work.  She still has panic episodes when she thinks of going back to work and getting exposed to excessive paperwork.  She has tried [facility] therapy sessions through her workplace, but were not effective as they only provided videos, but not active/live sessions with therapist.  She reports episodes of nasal congestion, cough and wheezing for the last 1 week.  Denies any fever or chills.  She has tried OTC Mucinex with some relief.  CHF: She has been seeing Dr Norman Clay for her congenital heart defect. Denies any headache, dizziness, chest pain, dyspnea or palpitations.  Atrial flutter: Followed by Dr. Rosita Fire.  She is on Toprol and Eliquis currently.  Denies any easy bruising or active bleeding.  Past Medical History:  Diagnosis Date   Acute tonsillitis    Cardiac murmur, unspecified    CHF (congestive heart failure) (HCC)    Congenital heart defect 1967   Diverticulosis    Endocarditis, valve unspecified    Heart disease     History of cardioversion    Hyperlipidemia    Pacemaker    6th grade   Unspecified chronic conjunctivitis, left eye     Past Surgical History:  Procedure Laterality Date   ABDOMINAL HYSTERECTOMY     APPENDECTOMY  10/2010   cardiac pacemaker procedure     CARDIAC SURGERY     TUBAL LIGATION      Family History  Problem Relation Age of Onset   Stroke Father    Diabetes Maternal Grandfather    Diabetes Paternal Grandmother    Breast cancer Paternal Grandmother    Breast cancer Paternal Aunt    Colon cancer Neg Hx    Esophageal cancer Neg Hx    Rectal cancer Neg Hx    Stomach cancer Neg Hx     Social History   Socioeconomic History   Marital status: Married    Spouse name: Not on file   Number of children: Not on file   Years of education: Not on file   Highest education level: Not on file  Occupational History   Not on file  Tobacco Use   Smoking status: Never   Smokeless tobacco: Never  Vaping Use   Vaping Use: Never used  Substance and Sexual Activity   Alcohol use: No   Drug use: No   Sexual activity: Not on file  Other Topics Concern   Not on file  Social History Narrative  Not on file   Social Determinants of Health   Financial Resource Strain: Not on file  Food Insecurity: Not on file  Transportation Needs: Not on file  Physical Activity: Not on file  Stress: Not on file  Social Connections: Not on file  Intimate Partner Violence: Not on file    Outpatient Medications Prior to Visit  Medication Sig Dispense Refill   calcium-vitamin D (OSCAL WITH D) 500-200 MG-UNIT per tablet Take 1 tablet by mouth.     digoxin (LANOXIN) 0.125 MG tablet TAKE 1 TABLET(0.125 MG) BY MOUTH DAILY 30 tablet 3   ELIQUIS 5 MG TABS tablet Take 5 mg by mouth 2 (two) times daily.     empagliflozin (JARDIANCE) 10 MG TABS tablet Take 10 mg by mouth daily.     furosemide (LASIX) 40 MG tablet TAKE 1 TABLET(40 MG) BY MOUTH DAILY 90 tablet 1   Magnesium (V-R MAGNESIUM) 250 MG  TABS Take 250 mg by mouth daily.      metoprolol succinate (TOPROL-XL) 25 MG 24 hr tablet TAKE 1 TABLET(25 MG) BY MOUTH DAILY 90 tablet 1   Multiple Vitamin (MULTIVITAMIN PO) Take 1 tablet by mouth daily.     rosuvastatin (CRESTOR) 5 MG tablet TAKE 1 TABLET(5 MG) BY MOUTH DAILY 90 tablet 1   spironolactone (ALDACTONE) 25 MG tablet Take 25 mg by mouth daily.     Facility-Administered Medications Prior to Visit  Medication Dose Route Frequency Provider Last Rate Last Admin   0.9 %  sodium chloride infusion  500 mL Intravenous Once Ladene Artist, MD        Allergies  Allergen Reactions   Shellfish Allergy     Hives    ROS Review of Systems  Constitutional:  Negative for chills and fever.  HENT:  Negative for congestion, sinus pressure, sinus pain and sore throat.   Eyes:  Negative for pain and discharge.  Respiratory:  Negative for cough and shortness of breath.   Cardiovascular:  Negative for chest pain, palpitations and leg swelling.  Gastrointestinal:  Negative for abdominal pain, constipation, diarrhea, nausea and vomiting.  Endocrine: Negative for polydipsia and polyuria.  Genitourinary:  Negative for dysuria and hematuria.  Musculoskeletal:  Negative for neck pain and neck stiffness.  Skin:  Negative for rash.  Neurological:  Negative for dizziness, syncope, weakness and numbness.  Psychiatric/Behavioral:  Negative for agitation and behavioral problems. The patient is nervous/anxious.       Objective:    Physical Exam Vitals reviewed.  Constitutional:      General: She is not in acute distress.    Appearance: She is not diaphoretic.  HENT:     Head: Normocephalic and atraumatic.     Nose: Nose normal. No congestion.     Mouth/Throat:     Mouth: Mucous membranes are moist.     Pharynx: No posterior oropharyngeal erythema.  Eyes:     General: No scleral icterus.    Extraocular Movements: Extraocular movements intact.  Cardiovascular:     Rate and Rhythm: Normal  rate and regular rhythm.     Pulses: Normal pulses.     Heart sounds: Murmur (Systolic (pronounced in left upper sternal border)) heard.  Pulmonary:     Breath sounds: Wheezing (Mild, diffuse) present. No rales.  Abdominal:     Palpations: Abdomen is soft.     Tenderness: There is no abdominal tenderness.  Musculoskeletal:     Cervical back: Neck supple. No rigidity or tenderness.  Right lower leg: No edema.     Left lower leg: No edema.  Skin:    General: Skin is warm.     Findings: No rash.  Neurological:     General: No focal deficit present.     Mental Status: She is alert and oriented to person, place, and time.     Cranial Nerves: No cranial nerve deficit.     Sensory: No sensory deficit.     Motor: No weakness.  Psychiatric:        Mood and Affect: Mood normal.        Behavior: Behavior normal.     BP 128/77 (BP Location: Left Arm, Patient Position: Sitting, Cuff Size: Normal)   Pulse 84   Ht _0  (1.575 m)   Wt 126 lb 12.8 oz (57.5 kg)   SpO2 90%   BMI 23.19 kg/m  Wt Readings from Last 3 Encounters:  09/22/22 126 lb 12.8 oz (57.5 kg)  08/31/22 127 lb 12.8 oz (58 kg)  08/03/22 130 lb (59 kg)    Lab Results  Component Value Date   TSH 3.130 08/03/2022   Lab Results  Component Value Date   WBC 7.2 08/03/2022   HGB 15.5 08/03/2022   HCT 46.9 (H) 08/03/2022   MCV 94 08/03/2022   PLT 121 (L) 08/03/2022   Lab Results  Component Value Date   NA 146 (H) 08/03/2022   K 4.5 08/03/2022   CO2 19 (L) 08/03/2022   GLUCOSE 135 (H) 08/03/2022   BUN 17 08/03/2022   CREATININE 0.94 08/03/2022   BILITOT 0.5 08/03/2022   ALKPHOS 91 08/03/2022   AST 21 08/03/2022   ALT 19 08/03/2022   PROT 7.7 08/03/2022   ALBUMIN 5.3 (H) 08/03/2022   CALCIUM 9.7 08/03/2022   ANIONGAP 8 07/02/2020   EGFR 71 08/03/2022   Lab Results  Component Value Date   CHOL 133 08/03/2022   Lab Results  Component Value Date   HDL 37 (L) 08/03/2022   Lab Results  Component Value  Date   LDLCALC 67 08/03/2022   Lab Results  Component Value Date   TRIG 169 (H) 08/03/2022   Lab Results  Component Value Date   CHOLHDL 3.6 08/03/2022   Lab Results  Component Value Date   HGBA1C 6.8 (H) 08/03/2022      Assessment & Plan:   Problem List Items Addressed This Visit       Other   GAD (generalized anxiety disorder) - Primary    Likely work-related stress Had get [facility] therapy from her workplace Referred to [facility] therapy at [facility] clinic Avoid any anxiolytic for now to avoid interaction with her cardiac medications FMLA paperwork submitted      Relevant Orders   Ambulatory referral to Psychiatry   Wheezing    Likely due to reactive airway disease Started Xopenex inhaler as needed, would avoid albuterol as she already has palpitations Mucinex as needed for cough      Relevant Medications   levalbuterol (XOPENEX HFA) 45 MCG/ACT inhaler    Meds ordered this encounter  Medications   levalbuterol (XOPENEX HFA) 45 MCG/ACT inhaler    Sig: Inhale 1 puff into the lungs every 6 (six) hours as needed for wheezing.    Dispense:  1 each    Refill:  12     Follow-up: Return if symptoms worsen or fail to improve.    Lindell Spar, MD

## 2022-09-23 NOTE — Telephone Encounter (Signed)
Was just seen 12.20.20 and asked if Dr Posey Pronto would do an extension letter to go along with her prudential forms.  Nurse spoked to patient.

## 2022-09-23 NOTE — Assessment & Plan Note (Signed)
Likely due to reactive airway disease Started Xopenex inhaler as needed, would avoid albuterol as she already has palpitations Mucinex as needed for cough

## 2022-09-24 NOTE — Telephone Encounter (Signed)
Note typed and faxed

## 2022-09-29 ENCOUNTER — Encounter: Payer: Self-pay | Admitting: Internal Medicine

## 2022-10-14 LAB — HM DIABETES EYE EXAM

## 2022-10-15 ENCOUNTER — Other Ambulatory Visit: Payer: Self-pay

## 2022-10-15 DIAGNOSIS — R932 Abnormal findings on diagnostic imaging of liver and biliary tract: Secondary | ICD-10-CM

## 2022-10-15 DIAGNOSIS — K769 Liver disease, unspecified: Secondary | ICD-10-CM

## 2022-11-01 ENCOUNTER — Other Ambulatory Visit (INDEPENDENT_AMBULATORY_CARE_PROVIDER_SITE_OTHER): Payer: 59

## 2022-11-01 ENCOUNTER — Ambulatory Visit (HOSPITAL_COMMUNITY)
Admission: RE | Admit: 2022-11-01 | Discharge: 2022-11-01 | Disposition: A | Discharge status: Home / Self Care | Payer: 59 | Source: Ambulatory Visit | Attending: Gastroenterology | Admitting: Gastroenterology

## 2022-11-01 DIAGNOSIS — K769 Liver disease, unspecified: Secondary | ICD-10-CM | POA: Insufficient documentation

## 2022-11-01 DIAGNOSIS — R932 Abnormal findings on diagnostic imaging of liver and biliary tract: Secondary | ICD-10-CM | POA: Diagnosis not present

## 2022-11-01 DIAGNOSIS — K8020 Calculus of gallbladder without cholecystitis without obstruction: Secondary | ICD-10-CM | POA: Diagnosis not present

## 2022-11-01 LAB — CBC WITH DIFFERENTIAL/PLATELET
Basophils Absolute: 0.1 10*3/uL (ref 0.0–0.1)
Basophils Relative: 0.9 % (ref 0.0–3.0)
Eosinophils Absolute: 0.2 10*3/uL (ref 0.0–0.7)
Eosinophils Relative: 2.1 % (ref 0.0–5.0)
HCT: 45.3 % (ref 36.0–46.0)
Hemoglobin: 15.6 g/dL — ABNORMAL HIGH (ref 12.0–15.0)
Lymphocytes Relative: 15.4 % (ref 12.0–46.0)
Lymphs Abs: 1.1 10*3/uL (ref 0.7–4.0)
MCHC: 34.5 g/dL (ref 30.0–36.0)
MCV: 93.3 fl (ref 78.0–100.0)
Monocytes Absolute: 0.6 10*3/uL (ref 0.1–1.0)
Monocytes Relative: 8 % (ref 3.0–12.0)
Neutro Abs: 5.4 10*3/uL (ref 1.4–7.7)
Neutrophils Relative %: 73.6 % (ref 43.0–77.0)
Platelets: 124 10*3/uL — ABNORMAL LOW (ref 150.0–400.0)
RBC: 4.85 Mil/uL (ref 3.87–5.11)
RDW: 13.6 % (ref 11.5–15.5)
WBC: 7.3 10*3/uL (ref 4.0–10.5)

## 2022-11-01 LAB — COMPREHENSIVE METABOLIC PANEL
ALT: 22 U/L (ref 0–35)
AST: 22 U/L (ref 0–37)
Albumin: 4.7 g/dL (ref 3.5–5.2)
Alkaline Phosphatase: 75 U/L (ref 39–117)
BUN: 18 mg/dL (ref 6–23)
CO2: 27 mEq/L (ref 19–32)
Calcium: 9.2 mg/dL (ref 8.4–10.5)
Chloride: 101 mEq/L (ref 96–112)
Creatinine, Ser: 0.8 mg/dL (ref 0.40–1.20)
GFR: 82.03 mL/min (ref >60.00)
Glucose, Bld: 120 mg/dL — ABNORMAL HIGH (ref 70–99)
Potassium: 4.1 mEq/L (ref 3.5–5.1)
Sodium: 138 mEq/L (ref 135–145)
Total Bilirubin: 0.7 mg/dL (ref 0.2–1.2)
Total Protein: 7.5 g/dL (ref 6.0–8.3)

## 2022-11-01 LAB — PROTIME-INR
INR: 1.7 ratio — ABNORMAL HIGH (ref 0.8–1.0)
Prothrombin Time: 17.9 s — ABNORMAL HIGH (ref 9.6–13.1)

## 2022-11-03 LAB — AFP TUMOR MARKER: AFP-Tumor Marker: 3.8 ng/mL

## 2022-11-12 ENCOUNTER — Ambulatory Visit: Payer: 59 | Admitting: Internal Medicine

## 2022-11-16 ENCOUNTER — Ambulatory Visit: Payer: 59 | Admitting: Internal Medicine

## 2022-11-16 ENCOUNTER — Encounter: Payer: Self-pay | Admitting: Internal Medicine

## 2022-11-16 VITALS — BP 107/72 | HR 81 | Resp 16 | Ht 62.0 in | Wt 127.0 lb

## 2022-11-16 DIAGNOSIS — L243 Irritant contact dermatitis due to cosmetics: Secondary | ICD-10-CM | POA: Diagnosis not present

## 2022-11-16 MED ORDER — TRIAMCINOLONE ACETONIDE 0.025 % EX OINT: 1.0000 | TOPICAL_OINTMENT | Freq: Two times a day (BID) | CUTANEOUS | 0 refills | Status: AC

## 2022-11-16 NOTE — Progress Notes (Signed)
   HPI:Ms.Brandi Fischer is a 57 y.o. female who presents for evaluation of rash on her eyelids.  .For the details of today's visit, please refer to the assessment and plan.  Patient reports that this happened once years ago after using eye shadow.  She recently used some Orajel and has a rash on her bilateral eyelids.  She is thrown out all her old eye shadow and cleaned her applicators.  The rash is mildly pruritic.  No joint pains or rashes in other areas.   Physical Exam: Vitals:   11/16/22 0937  BP: 107/72  Pulse: 81  Resp: 16  SpO2: 92%  Weight: 127 lb (57.6 kg)  Height: '5\' 2"'$  (1.575 m)     Physical Exam Constitutional:      Appearance: She is well-developed and well-groomed.  Eyes:      Comments: Bilateral erythematous macules in areas outlined.   Pulmonary:     Effort: Pulmonary effort is normal.      Assessment & Plan:   Irritant contact dermatitis due to cosmetics - Patient will avoid shadow. She will also wear her glasses and not contacts while using steroid cream - triamcinolone (KENALOG) 0.025 % ointment; Apply 1 Application topically 2 (two) times daily.  Dispense: 30 g; Refill: 0 - Follow up if symptoms worsen or fail to improve    Lorene Dy, MD

## 2022-11-16 NOTE — Patient Instructions (Addendum)
Thank you for trusting me with your care. To recap, today we discussed the following:    Irritant contact dermatitis due to cosmetics - triamcinolone (KENALOG) 0.025 % ointment; Apply 1 Application topically 2 (two) times daily.  Dispense: 30 g; Refill: 0 - Follow up if symptoms worsen or fail to improve

## 2022-11-16 NOTE — Assessment & Plan Note (Addendum)
-   Patient will avoid eye shadow. She will also wear her glasses and not contacts while using steroid cream - triamcinolone (KENALOG) 0.025 % ointment; Apply 1 Application topically 2 (two) times daily.  Dispense: 30 g; Refill: 0 - Follow up if symptoms worsen or fail to improve

## 2022-11-17 DIAGNOSIS — Z950 Presence of cardiac pacemaker: Secondary | ICD-10-CM | POA: Diagnosis not present

## 2022-11-18 ENCOUNTER — Ambulatory Visit (INDEPENDENT_AMBULATORY_CARE_PROVIDER_SITE_OTHER): Payer: 59 | Admitting: Clinical

## 2022-11-18 ENCOUNTER — Encounter (HOSPITAL_COMMUNITY): Payer: Self-pay

## 2022-11-18 DIAGNOSIS — F4322 Adjustment disorder with anxiety: Secondary | ICD-10-CM | POA: Diagnosis not present

## 2022-11-18 NOTE — Progress Notes (Signed)
IN PERSON  I connected with Brandi Fischer on 11/18/22 at  9:00 AM EST in person and verified that I am speaking with the correct person using two identifiers.  Location: Patient: OFFICE Provider: OFFICE   I discussed the limitations of evaluation and management by telemedicine and the availability of in person appointments. The patient expressed understanding and agreed to proceed.     Comprehensive Clinical Assessment (CCA) Note  11/18/2022 Brandi Fischer [id number]  Chief Complaint: Difficulty with life changes and life stage adjustment with anxiety Visit Diagnosis: Adjustment Disorder with anxious mood    CCA Screening, Triage and Referral (STR)  Patient Reported Information How did you hear about [place]? No data recorded Referral name: No data recorded Referral phone number: No data recorded  Whom do you see for routine medical problems? No data recorded Practice/Facility Name: No data recorded Practice/Facility Phone Number: No data recorded Name of Contact: No data recorded Contact Number: No data recorded Contact Fax Number: No data recorded Prescriber Name: No data recorded Prescriber Address (if known): No data recorded  What Is the Reason for Your Visit/Call Today? No data recorded How Long Has This Been Causing You Problems? No data recorded What Do You Feel Would Help You the Most Today? No data recorded  Have You Recently Been in Any Inpatient Treatment (Hospital/Detox/Crisis Center/28-Day Program)? No data recorded Name/Location of Program/Hospital:No data recorded How Long Were You There? No data recorded When Were You Discharged? No data recorded  Have You Ever Received Services From [facility] Before? No data recorded Who Do You See at [facility]? No data recorded  Have You Recently Had Any Thoughts About Hurting Yourself? No data recorded Are You Planning to Commit Suicide/Harm Yourself At This time? No data recorded  Have you Recently Had  Thoughts About [place]? No data recorded Explanation: No data recorded  Have You Used Any Alcohol or Drugs in the Past 24 Hours? No data recorded How Long Ago Did You Use Drugs or Alcohol? No data recorded What Did You Use and How Much? No data recorded  Do You Currently Have a Therapist/Psychiatrist? No data recorded Name of Therapist/Psychiatrist: No data recorded  Have You Been Recently Discharged From Any Office Practice or Programs? No data recorded Explanation of Discharge From Practice/Program: No data recorded    CCA Screening Triage Referral Assessment Type of Contact: No data recorded Is this Initial or Reassessment? No data recorded Date Telepsych consult ordered in [facility]:  No data recorded Time Telepsych consult ordered in [facility]:  No data recorded  Patient Reported Information Reviewed? No data recorded Patient Left Without Being Seen? No data recorded Reason for Not Completing Assessment: No data recorded  Collateral Involvement: No data recorded  Does Patient Have a [place]? No data recorded Name and Contact of Legal Guardian: No data recorded If Minor and Not Living with Parent(s), Who has Custody? No data recorded Is CPS involved or ever been involved? No data recorded Is APS involved or ever been involved? No data recorded  Patient Determined To Be At Risk for Harm To Self or Others Based on Review of Patient Reported Information or Presenting Complaint? No data recorded Method: No data recorded Availability of Means: No data recorded Intent: No data recorded Notification Required: No data recorded Additional Information for Danger to Others Potential: No data recorded Additional Comments for Danger to Others Potential: No data recorded Are There Guns or Other Weapons in Your Home? No  data recorded Types of Guns/Weapons: No data recorded Are These Weapons Safely Secured?                            No data recorded Who Could  Verify You Are Able To Have These Secured: No data recorded Do You Have any Outstanding Charges, Pending Court Dates, Parole/Probation? No data recorded Contacted To Inform of Risk of Harm To Self or Others: No data recorded  Location of Assessment: No data recorded  Does Patient Present under Involuntary Commitment? No data recorded IVC Papers Initial File Date: No data recorded  [place] of Residence: No data recorded  Patient Currently Receiving the Following Services: No data recorded  Determination of Need: No data recorded  Options For Referral: No data recorded    CCA Biopsychosocial Intake/Chief Complaint:  The patient was referred by her PCP Dr. Posey Pronto for further evaluation for [facility] service .  Current Symptoms/Problems: The patient notes, " I am currently on leave from work due to being stressed and overwhelmed noting alot of life changes in a short period of time.   Patient Reported Schizophrenia/Schizoaffective Diagnosis in Past: No   Strengths: I think i get along well with others, i am a great encourager to others, i am very authentic, i am very tender hearted and try to be empathetic towards others.  Preferences: The patient notes, " I like to Read, walking the dog , walking in general being outside, walking paths and being outdoors.  Abilities: None noted   Type of Services Patient Feels are Needed: Individual therapy / ( The patient notes not being interested in Med management   Initial Clinical Notes/Concerns: The patient notes no prior Couseling involvement for MH, no current or prior med therapy for MH. No prior hospitalizations for MH.anf no S/I or H/I.   Mental Health Symptoms Depression:   Difficulty Concentrating; Weight gain/loss   Duration of Depressive symptoms:  Greater than two weeks   Mania:   None   Anxiety:    Worrying; Tension; Difficulty concentrating; Restlessness   Psychosis:   None   Duration of Psychotic symptoms: NA  Trauma:    None   Obsessions:   None   Compulsions:   None   Inattention:   None   Hyperactivity/Impulsivity:   None   Oppositional/Defiant Behaviors:   None   Emotional Irregularity:   None   Other Mood/Personality Symptoms:   na    Mental Status Exam Appearance and self-care  Stature:   Small   Weight:   Average weight   Clothing:   Casual   Grooming:   Normal   Cosmetic use:   Age appropriate   Posture/gait:   Normal   Motor activity:   Not Remarkable   Sensorium  Attention:   Normal   Concentration:   Anxiety interferes   Orientation:   X5   Recall/memory:   Normal   Affect and Mood  Affect:   Appropriate   Mood:   Anxious   Relating  Eye contact:   None   Facial expression:   Responsive   Attitude toward examiner:   Cooperative   Thought and Language  Speech flow:  Normal   Thought content:   Appropriate to Mood and Circumstances   Preoccupation:   None   Hallucinations:   None   Organization:  Logical  [employer] of Knowledge:   Good   Intelligence:   Average  Abstraction:   Normal   Judgement:   Good   Reality Testing:   Realistic   Insight:   Good   Decision Making:   Normal   Social Functioning  Social Maturity:   Responsible   Social Judgement:   Normal   Stress  Stressors:   Illness; Transitions; 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 Most recently the patient in 19-Dec-2019 had a problem with obstruction post a prior open heart surgery and is currently on ongoing medications for heart health. Work related stress.)   Coping Ability:   Normal   Skill Deficits:   None   Supports:   Family; Church     Religion: Religion/Spirituality Are You A Religious Person?: Yes What is Your Religious Affiliation?: Baptist How Might This Affect Treatment?: Protective Factor  Leisure/Recreation: Leisure / Recreation Do You Have Hobbies?:  No  Exercise/Diet: Exercise/Diet Do You Exercise?: Yes What Type of Exercise Do You Do?: Hiking, Run/Walk How Many Times a Week Do You Exercise?: 6-7 times a week Have You Gained or Lost A Significant Amount of Weight in the Past Six Months?: Yes-Lost Number of Pounds Lost?: 10 Do You Follow a Special Diet?: No Do You Have Any Trouble Sleeping?: No   CCA Employment/Education Employment/Work Situation: Employment / Work Situation Employment Situation: Employed 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 [employer] Did Teacher, adult education From [employer]?: Yes Did You Attend College?: Yes What Type of College Degree Do you Have?: [employer]  (did not finish program ) Did Heritage manager?: No What Was Your Major?: NA Did You Have Any Special Interests In School?: NA Did You Have An Individualized Education Program (IIEP): No Did You Have Any Difficulty At School?: No Patient's Education Has Been Impacted by Current Illness: No   CCA Family/Childhood History Family and Relationship History: Family history Marital status: Married Number of Years Married: 94 What types of issues is patient dealing with in the relationship?:  None noted Additional relationship information: No Additional Are you sexually active?: Yes What is your sexual orientation?: Heterosexual Has your sexual activity been affected by drugs, alcohol, medication, or emotional stress?: NA Does patient have children?: No  Childhood History:  Childhood History By whom was/is the patient raised?: Both parents Additional childhood history information: No Addtional Description of patient's relationship with caregiver when they were a child: Very Good relationship with both parents. Patient's description of current relationship with people who raised him/her: The patients Father passed away in 2008-12-18, and the patients Mother celebrated her 8 birthday in December and the patient notes having a great  relationship currently with her Mother. How were you disciplined when you got in trouble as a child/adolescent?:  Grounded Does patient have siblings?: No Did patient suffer any verbal/emotional/physical/sexual abuse as a child?: No Did patient suffer from severe childhood neglect?: No Has patient ever been sexually abused/assaulted/raped as an adolescent or adult?: No Was the patient ever a victim of a crime or a disaster?: No Witnessed domestic violence?: No Has patient been affected by domestic violence as an adult?: No  Child/Adolescent Assessment:     CCA Substance Use Alcohol/Drug Use: Alcohol / Drug Use Pain Medications: See MAR Prescriptions: See MAR Over the Counter: Benadryl occationally to help with sleep. History of alcohol / drug use?: No history of alcohol / drug abuse Longest period of sobriety (when/how long): NA                         ASAM's:  Six Dimensions of Multidimensional Assessment  Dimension 1:  Acute Intoxication and/or Withdrawal Potential:      Dimension 2:  Biomedical Conditions and Complications:      Dimension 3:  Emotional, Behavioral, or Cognitive Conditions and Complications:     Dimension 4:   Readiness to Change:     Dimension 5:  Relapse, Continued use, or Continued Problem Potential:     Dimension 6:  Recovery/Living Environment:     ASAM Severity Score:    ASAM Recommended Level of Treatment:     Substance use Disorder (SUD)    Recommendations for Services/Supports/Treatments: Recommendations for Services/Supports/Treatments Recommendations For Services/Supports/Treatments: Individual Therapy  DSM5 Diagnoses: Patient Active Problem List   Diagnosis Date Noted   Irritant contact dermatitis due to cosmetics 11/16/2022   Wheezing 09/22/2022   GAD (generalized anxiety disorder) 08/03/2022   Osteopenia 03/24/2022   Diabetes mellitus ([place]) 07/23/2021   Encounter for general adult medical examination with abnormal findings [id number]   Chronic systolic CHF (congestive heart failure) ([place]) 06/13/2021   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   Paroxysmal atrial fibrillation ([place]) 06/08/2021   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   Mixed hyperlipidemia 01/21/2021   Pulmonary regurgitation 10/22/2020   Neck muscle strain 07/22/2020   H/O Mustard procedure 07/04/2020   Edema of both legs 02/20/2020   Essential hypertension, benign 02/20/2020   Thrombocytopenia ([place]) 11/28/2019   Atypical atrial flutter ([place]) 05/01/2018   Cardiac pacemaker in situ 01/15/2013   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   Patient/Guardian was advised Release of Information must be obtained prior to  any record release in order to collaborate their care with an outside provider. Patient/Guardian was advised if they have not already done so to contact the registration department to sign all necessary forms in order for [place] to release information regarding their care.   Consent: Patient/Guardian gives verbal consent for treatment and assignment of benefits for services provided during this visit. Patient/Guardian expressed understanding and agreed to proceed.   I discussed the assessment and treatment plan with the patient. The patient was provided an  opportunity to ask questions and all were answered. The patient agreed with the plan and demonstrated an understanding of the instructions.   The patient was advised to call back or seek an in-person evaluation if the symptoms worsen or if the condition fails to improve as anticipated.  I provided 60 minutes of face-to-face time during this encounter.  Lennox Grumbles, LCSW  11/18/2022

## 2022-11-24 ENCOUNTER — Other Ambulatory Visit (INDEPENDENT_AMBULATORY_CARE_PROVIDER_SITE_OTHER): Payer: 59

## 2022-11-24 ENCOUNTER — Encounter: Payer: Self-pay | Admitting: Gastroenterology

## 2022-11-24 ENCOUNTER — Ambulatory Visit: Payer: 59 | Admitting: Gastroenterology

## 2022-11-24 VITALS — BP 108/66 | HR 77 | Ht 61.5 in | Wt 126.0 lb

## 2022-11-24 DIAGNOSIS — K8020 Calculus of gallbladder without cholecystitis without obstruction: Secondary | ICD-10-CM | POA: Diagnosis not present

## 2022-11-24 DIAGNOSIS — K7460 Unspecified cirrhosis of liver: Secondary | ICD-10-CM

## 2022-11-24 DIAGNOSIS — Z7901 Long term (current) use of anticoagulants: Secondary | ICD-10-CM | POA: Diagnosis not present

## 2022-11-24 LAB — IBC + FERRITIN
Ferritin: 141 ng/mL (ref 10.0–291.0)
Iron: 160 ug/dL — ABNORMAL HIGH (ref 42–145)
Saturation Ratios: 34.1 % (ref 20.0–50.0)
TIBC: 469 ug/dL — ABNORMAL HIGH (ref 250.0–450.0)
Transferrin: 335 mg/dL (ref 212.0–360.0)

## 2022-11-24 NOTE — Patient Instructions (Addendum)
Your provider has requested that you go to the basement level for lab work before leaving today. Press "B" on the elevator. The lab is located at the first door on the left as you exit the elevator.  Please follow up in a year with [place]. _______________________________________________________  If your blood pressure at your visit was 140/90 or greater, please contact your primary care physician to follow up on this.  _______________________________________________________  If you are age 92 or older, your body mass index should be between 23-30. Your Body mass index is 23.42 kg/m. If this is out of the aforementioned range listed, please consider follow up with your Primary Care Provider.  If you are age 20 or younger, your body mass index should be between 19-25. Your Body mass index is 23.42 kg/m. If this is out of the aformentioned range listed, please consider follow up with your Primary Care Provider.   ________________________________________________________  The [facility] providers would like to encourage you to use [facility] to communicate with providers for non-urgent requests or questions.  Due to long hold times on the telephone, sending your provider a message by [facility] may be a faster and more efficient way to get a response.  Please allow 48 business hours for a response.  Please remember that this is for non-urgent requests.  _______________________________________________________ Thank you for choosing me and [facility].  Pricilla Riffle. Dagoberto Ligas., MD., Marval Regal

## 2022-11-24 NOTE — Progress Notes (Signed)
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 She has no GI complaints.  Recent RUQ ultrasound as below.  Her platelets remain slightly low at 124 K.  Her INR remains elevated and improved however she is maintained on Eliquis.  She maintains close follow-up with her cardiologists at [facility].  She was previously followed at [facility] cardiology however her primary cardiologist moved to [facility] and she followed her.  She has noted a 20 pound weight loss on Jardiance.  Her BMI is now normal at 23.42.   Labs / Imaging       Latest Ref Rng & Units 11/01/2022    8:59 AM 08/03/2022    9:19 AM 07/23/2021    9:51 AM  Hepatic Function  Total Protein 6.0 - 8.3 g/dL 7.5  7.7  7.9   Albumin 3.5 - 5.2 g/dL 4.7  5.3  5.2   AST 0 - 37 U/L 22  21  25   $ ALT 0 - 35 U/L 22  19  22   $ Alk Phosphatase 39 - 117 U/L 75  91  99   Total Bilirubin 0.2 - 1.2 mg/dL 0.7  0.5  0.8        Latest Ref Rng & Units 11/01/2022    8:59 AM 08/03/2022    9:19 AM 02/15/2022    8:20 AM  CBC  WBC 4.0 - 10.5 K/uL 7.3  7.2  6.7   Hemoglobin 12.0 - 15.0 g/dL 15.6  15.5  14.7   Hematocrit 36.0 - 46.0 % 45.3  46.9  43.5   Platelets 150.0 - 400.0 K/uL 124.0  121  123.0      US Abdomen Limited RUQ (LIVER/GB)  Result Date: 11/01/2022 CLINICAL DATA:  Suspect did cirrhosis.  Abnormal CT. EXAM: ULTRASOUND ABDOMEN LIMITED RIGHT UPPER QUADRANT COMPARISON:  CT scan of the abdomen  and pelvis April 13, 2022. Ultrasound of the abdomen Feb 15, 2022. FINDINGS: Gallbladder: Cholelithiasis in an otherwise normal appearing gallbladder. Common bile duct: Diameter: 4.3 mm Liver: Heterogeneous echogenicity. Mildly nodular contour. Portal vein is patent on color Doppler imaging with normal direction of blood flow towards the liver. Other: None. IMPRESSION: 1. Hepatic morphology suggests cirrhosis. No mass identified. Normal directional blood flow in the portal vein. 2. Cholelithiasis in an otherwise normal appearing gallbladder. Electronically Signed   By: Dorise Bullion III M.D.   On: 11/01/2022 11:49    Current Medications, Allergies, Past Medical History, Past Surgical History, Family History and Social History were reviewed in [employer] record.   Physical Exam: General: Well developed, well nourished, no acute distress Head: Normocephalic and atraumatic Eyes: Sclerae anicteric, EOMI Ears: Normal auditory acuity Mouth: No deformities or lesions noted Lungs: Clear throughout to auscultation Heart: Regular rate and rhythm; No rubs or bruits. Harsh systolic murmur Abdomen: Soft, non tender and non distended. Left hepatic lobe palpable in the epigastrium. No masses, splenomegaly or hernias noted. Normal Bowel sounds  Rectal: Not done  Musculoskeletal: Symmetrical with no gross deformities  Pulses:  Normal pulses noted Extremities: No edema or deformities noted Neurological: Alert oriented x 4, grossly nonfocal Psychological:  Alert and cooperative. Normal mood and affect   Elishua Radford T. Fuller Plan, MD 11/24/2022, 9:43 AM

## 2022-11-25 ENCOUNTER — Other Ambulatory Visit: Payer: Self-pay | Admitting: Internal Medicine

## 2022-11-28 LAB — ANTI-SMOOTH MUSCLE ANTIBODY, IGG: Actin (Smooth Muscle) Antibody (IGG): <20 U (ref <20)

## 2022-11-28 LAB — IGA: Immunoglobulin A: 208 mg/dL (ref 47–310)

## 2022-11-28 LAB — IGG: IgG (Immunoglobin G), Serum: 1020 mg/dL (ref 600–1640)

## 2022-11-28 LAB — CERULOPLASMIN: Ceruloplasmin: 34 mg/dL (ref 18–53)

## 2022-11-28 LAB — HEPATITIS B SURFACE ANTIBODY,QUALITATIVE: Hep B S Ab: NONREACTIVE

## 2022-11-28 LAB — ANTI-NUCLEAR AB-TITER (ANA TITER): ANA Titer 1: 1:320 {titer} — ABNORMAL HIGH

## 2022-11-28 LAB — ALPHA-1-ANTITRYPSIN: A-1 Antitrypsin, Ser: 127 mg/dL (ref 83–199)

## 2022-11-28 LAB — ANA: Anti Nuclear Antibody (ANA): POSITIVE — AB

## 2022-11-28 LAB — MITOCHONDRIAL ANTIBODIES: Mitochondrial M2 Ab, IgG: <=20 U (ref <=20.0)

## 2022-11-28 LAB — TISSUE TRANSGLUTAMINASE, IGA: (tTG) Ab, IgA: <1 U/mL

## 2022-11-28 LAB — HEPATITIS B SURFACE ANTIGEN: Hepatitis B Surface Ag: NONREACTIVE

## 2022-12-21 DIAGNOSIS — Q203 Discordant ventriculoarterial connection: Secondary | ICD-10-CM | POA: Diagnosis not present

## 2022-12-29 ENCOUNTER — Ambulatory Visit (INDEPENDENT_AMBULATORY_CARE_PROVIDER_SITE_OTHER): Payer: 59 | Admitting: Clinical

## 2022-12-29 DIAGNOSIS — F4322 Adjustment disorder with anxiety: Secondary | ICD-10-CM | POA: Diagnosis not present

## 2022-12-29 NOTE — Progress Notes (Signed)
IN PERSON  I connected with Brandi Fischer on 12/29/22 at 10:00AM in person and verified that I am speaking with the correct person using two identifiers.  Location: Patient: Office Provider: Office   I discussed the limitations of evaluation and management by telemedicine and the availability of in person appointments. The patient expressed understanding and agreed to proceed.( IN PERSON)  Therapy Progress Note   Session Time: 10:00AM-10:45AM   Participation Level: Active   Behavioral Response: CasualAlertDepressed   Type of Therapy: Individual Therapy   Treatment Goals addressed: Diagnosis: MDD and GAD   Interventions: CBT, Motivational Interviewing, Solution Focused, Strength-based and Supportive   Summary: Brandi Fischer is a 56 y.o. female who presents  with Adjustment Disorder with Anxiety. The OPT therapist worked with the patient for her OPT session. The OPT therapist utilized Motivational Interviewing to assist in creating therapeutic repore. The patient in the session was engaged and work in collaboration giving feedback about her triggers and symptoms over the past few weeks. The patient noted she since the last session did formally retire due to her heart condition and risk factors of work related stress.The patient spoke about the ongoing impact of caregiving for her elderly parents.The OPT therapist utilized Cognitive Behavioral Therapy through cognitive restructuring as well as worked with the patient on coping strategies to assist in management of anxiety. The patient spoke about her ongoing progress of her adjustment during transition post retirement .The patient worked with the [place] therapist in the session on self check-ins, time management, and self care.   Suicidal/Homicidal: Nowithout intent/plan   Therapist Response: The OPT therapist worked with the patient for the patients ongoing OPT treatment. The patient was engaged in her session and gave feedback in  relation to triggers, symptoms, and behavior responses over the past few weeks. The OPT therapist worked with the patient utilizing an in session Cognitive Behavioral Therapy exercise. The patient verbalized, " I made the decision to retire and not return to work". The patient noted that she feels the past few weeks have been better with her stress lower since retiring. The patient noted she does plan to stay busy and did not there has been stress just adjusting post retirement including having to find new insurance.. The OPT therapist will continue treatment work with the patient in her next scheduled session.   Plan: Return again in 2/3 weeks.   Diagnosis:      Axis I: Adjustment Disorder with Anxiety                         Axis II: No diagnosis   I discussed the assessment and treatment plan with the patient. The patient was provided an opportunity to ask questions and all were answered. The patient agreed with the plan and demonstrated an understanding of the instructions.   The patient was advised to call back or seek an in-person evaluation if the symptoms worsen or if the condition fails to improve as anticipated.   I provided 45 minutes of face-to-face time during this encounter.   Coralyn Mark T Yama Nielson,LCSW   12/29/2022

## 2023-01-25 ENCOUNTER — Ambulatory Visit: Payer: 59 | Admitting: Internal Medicine

## 2023-01-25 ENCOUNTER — Encounter: Payer: Self-pay | Admitting: Internal Medicine

## 2023-01-25 VITALS — BP 116/62 | HR 85 | Ht 61.5 in | Wt 127.6 lb

## 2023-01-25 DIAGNOSIS — I10 Essential (primary) hypertension: Secondary | ICD-10-CM | POA: Diagnosis not present

## 2023-01-25 DIAGNOSIS — I484 Atypical atrial flutter: Secondary | ICD-10-CM

## 2023-01-25 DIAGNOSIS — E782 Mixed hyperlipidemia: Secondary | ICD-10-CM | POA: Diagnosis not present

## 2023-01-25 DIAGNOSIS — E1169 Type 2 diabetes mellitus with other specified complication: Secondary | ICD-10-CM | POA: Diagnosis not present

## 2023-01-25 DIAGNOSIS — E559 Vitamin D deficiency, unspecified: Secondary | ICD-10-CM | POA: Diagnosis not present

## 2023-01-25 DIAGNOSIS — F411 Generalized anxiety disorder: Secondary | ICD-10-CM

## 2023-01-25 DIAGNOSIS — Z8774 Personal history of (corrected) congenital malformations of heart and circulatory system: Secondary | ICD-10-CM

## 2023-01-25 DIAGNOSIS — Z7901 Long term (current) use of anticoagulants: Secondary | ICD-10-CM

## 2023-01-25 DIAGNOSIS — Z950 Presence of cardiac pacemaker: Secondary | ICD-10-CM

## 2023-01-25 DIAGNOSIS — I370 Nonrheumatic pulmonary valve stenosis: Secondary | ICD-10-CM

## 2023-01-25 LAB — POCT GLYCOSYLATED HEMOGLOBIN (HGB A1C): HbA1c, POC (controlled diabetic range): 6.1 % (ref 0.0–7.0)

## 2023-01-25 NOTE — Assessment & Plan Note (Signed)
S/p Cardioversion in 02/2020 On Metoprolol, Digoxin and Coumadin 

## 2023-01-25 NOTE — Progress Notes (Signed)
Established Patient Office Visit  Subjective:  Patient ID: Brandi Fischer, female    DOB: Feb 06, 1966  Age: 57 y.o. MRN: [id number]  CC:  Chief Complaint  Patient presents with   Anxiety    Six month follow up    HPI Brandi Fischer is a 57 y.o. 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  DM: Her HbA1c is 6.1 now.  She is taking Jardiance 25 mg QD.  Denies polyuria or polyphagia currently.  GAD: She has retired now and has been feeling better now. She had been experiencing work-related stress.  She had been feeling overwhelmed at times.  She had spells of severe anxiety, but denies any anhedonia, SI or HI currently.  Due to work related stress, she had labile blood pressure and palpitations.  She has felt better since being off of work. She has tried [facility] therapy sessions and reports benefiting from it.  CHF: She has been seeing Dr Lyndel Safe for her congenital heart defect. Denies any headache, dizziness, chest pain, dyspnea or palpitations.  Atrial flutter: Followed by Dr. Sharol Harness.  She is on Toprol and Eliquis currently.  Denies any easy bruising or active bleeding.  Past Medical History:  Diagnosis Date   Acute tonsillitis    Cardiac murmur, unspecified    CHF (congestive heart failure)    Congenital heart defect 1967   Diverticulosis    Endocarditis, valve unspecified    Heart disease    History of cardioversion    Hyperlipidemia    Pacemaker    6th grade   Unspecified chronic conjunctivitis, left eye     Past Surgical History:  Procedure Laterality Date   ABDOMINAL HYSTERECTOMY     APPENDECTOMY  10/2010   cardiac pacemaker procedure     CARDIAC SURGERY     TUBAL LIGATION      Family History  Problem Relation Age of Onset   Stroke Father     Diabetes Maternal Grandfather    Diabetes Paternal Grandmother    Breast cancer Paternal Grandmother    Breast cancer Paternal Aunt    Colon cancer Neg Hx    Esophageal cancer Neg Hx    Rectal cancer Neg Hx    Stomach cancer Neg Hx     Social History   Socioeconomic History   Marital status: Married    Spouse name: Not on file   Number of children: Not on file   Years of education: Not on file   Highest education level: Not on file  Occupational History   Not on file  Tobacco Use   Smoking status: Never   Smokeless tobacco: Never  Vaping Use   Vaping Use: Never used  Substance and Sexual Activity   Alcohol use: No   Drug use: No   Sexual activity: Not on file  Other Topics Concern   Not on file  Social History Narrative   Not on file   Social Determinants of Health   Financial Resource Strain: Not on file  Food Insecurity: Not on file  Transportation Needs: Not on file  Physical Activity: Not on file  Stress: Not on file  Social Connections: Not on file  Intimate Partner Violence: Not on file    Outpatient Medications Prior to Visit  Medication Sig Dispense Refill  empagliflozin (JARDIANCE) 25 MG TABS tablet Take 25 mg by mouth daily.     calcium-vitamin D (OSCAL WITH D) 500-200 MG-UNIT per tablet Take 1 tablet by mouth.     digoxin (LANOXIN) 0.125 MG tablet TAKE 1 TABLET(0.125 MG) BY MOUTH DAILY 30 tablet 3   ELIQUIS 5 MG TABS tablet Take 5 mg by mouth 2 (two) times daily.     furosemide (LASIX) 40 MG tablet TAKE 1 TABLET(40 MG) BY MOUTH DAILY 90 tablet 1   levalbuterol (XOPENEX HFA) 45 MCG/ACT inhaler Inhale 1 puff into the lungs every 6 (six) hours as needed for wheezing. 1 each 12   Magnesium (V-R MAGNESIUM) 250 MG TABS Take 250 mg by mouth daily.      metoprolol succinate (TOPROL-XL) 25 MG 24 hr tablet TAKE 1 TABLET(25 MG) BY MOUTH DAILY 90 tablet 1   Multiple Vitamin (MULTIVITAMIN PO) Take 1 tablet by mouth daily.     rosuvastatin (CRESTOR) 5 MG  tablet TAKE 1 TABLET(5 MG) BY MOUTH DAILY 90 tablet 1   spironolactone (ALDACTONE) 25 MG tablet Take 25 mg by mouth daily.     triamcinolone (KENALOG) 0.025 % ointment Apply 1 Application topically 2 (two) times daily. 30 g 0   empagliflozin (JARDIANCE) 10 MG TABS tablet Take 10 mg by mouth daily.     Facility-Administered Medications Prior to Visit  Medication Dose Route Frequency Provider Last Rate Last Admin   0.9 %  sodium chloride infusion  500 mL Intravenous Once Meryl Dare, MD        Allergies  Allergen Reactions   Shellfish Allergy     Hives    ROS Review of Systems  Constitutional:  Negative for chills and fever.  HENT:  Negative for congestion, sinus pressure, sinus pain and sore throat.   Eyes:  Negative for pain and discharge.  Respiratory:  Negative for cough and shortness of breath.   Cardiovascular:  Negative for chest pain, palpitations and leg swelling.  Gastrointestinal:  Negative for abdominal pain, constipation, diarrhea, nausea and vomiting.  Endocrine: Negative for polydipsia and polyuria.  Genitourinary:  Negative for dysuria and hematuria.  Musculoskeletal:  Negative for neck pain and neck stiffness.  Skin:  Negative for rash.  Neurological:  Negative for dizziness, syncope, weakness and numbness.  Psychiatric/Behavioral:  Negative for agitation and behavioral problems. The patient is nervous/anxious.       Objective:    Physical Exam Vitals reviewed.  Constitutional:      General: She is not in acute distress.    Appearance: She is not diaphoretic.  HENT:     Head: Normocephalic and atraumatic.     Nose: Nose normal. No congestion.     Mouth/Throat:     Mouth: Mucous membranes are moist.     Pharynx: No posterior oropharyngeal erythema.  Eyes:     General: No scleral icterus.    Extraocular Movements: Extraocular movements intact.  Cardiovascular:     Rate and Rhythm: Normal rate and regular rhythm.     Pulses: Normal pulses.      Heart sounds: Murmur (Systolic (pronounced in left upper sternal border)) heard.  Pulmonary:     Breath sounds: No wheezing or rales.  Abdominal:     Palpations: Abdomen is soft.     Tenderness: There is no abdominal tenderness.  Musculoskeletal:     Cervical back: Neck supple. No rigidity or tenderness.     Right lower leg: No edema.     Left lower  leg: No edema.  Skin:    General: Skin is warm.     Findings: No rash.  Neurological:     General: No focal deficit present.     Mental Status: She is alert and oriented to person, place, and time.     Cranial Nerves: No cranial nerve deficit.     Sensory: No sensory deficit.     Motor: No weakness.  Psychiatric:        Mood and Affect: Mood normal.        Behavior: Behavior normal.     BP 116/62 (BP Location: Right Arm, Patient Position: Sitting, Cuff Size: Normal)   Pulse 85   Ht 5' 1.5" (1.562 m)   Wt 127 lb 9.6 oz (57.9 kg)   SpO2 92%   BMI 23.72 kg/m  Wt Readings from Last 3 Encounters:  01/25/23 127 lb 9.6 oz (57.9 kg)  11/24/22 126 lb (57.2 kg)  11/16/22 127 lb (57.6 kg)    Lab Results  Component Value Date   TSH 3.130 08/03/2022   Lab Results  Component Value Date   WBC 7.3 11/01/2022   HGB 15.6 (H) 11/01/2022   HCT 45.3 11/01/2022   MCV 93.3 11/01/2022   PLT 124.0 (L) 11/01/2022   Lab Results  Component Value Date   NA 138 11/01/2022   K 4.1 11/01/2022   CO2 27 11/01/2022   GLUCOSE 120 (H) 11/01/2022   BUN 18 11/01/2022   CREATININE 0.80 11/01/2022   BILITOT 0.7 11/01/2022   ALKPHOS 75 11/01/2022   AST 22 11/01/2022   ALT 22 11/01/2022   PROT 7.5 11/01/2022   ALBUMIN 4.7 11/01/2022   CALCIUM 9.2 11/01/2022   ANIONGAP 8 07/02/2020   EGFR 71 08/03/2022   GFR 82.03 11/01/2022   Lab Results  Component Value Date   CHOL 133 08/03/2022   Lab Results  Component Value Date   HDL 37 (L) 08/03/2022   Lab Results  Component Value Date   LDLCALC 67 08/03/2022   Lab Results  Component Value  Date   TRIG 169 (H) 08/03/2022   Lab Results  Component Value Date   CHOLHDL 3.6 08/03/2022   Lab Results  Component Value Date   HGBA1C 6.1 01/25/2023      Assessment & Plan:   Problem List Items Addressed This Visit       Cardiovascular and Mediastinum   Essential hypertension, benign    BP Readings from Last 1 Encounters:  01/25/23 116/62  Well-controlled with Metoprolol and Spironolactone Counseled for compliance with the medications Advised DASH diet and moderate exercise/walking, at least 150 mins/week      Atypical atrial flutter    S/p Cardioversion in 02/2020 On Metoprolol, Digoxin and Coumadin      Relevant Orders   TSH   Severe pulmonic valve stenosis    Currently euvolemic Followed by Cardiology at [facility]        Endocrine   Diabetes mellitus - Primary    Lab Results  Component Value Date   HGBA1C 6.1 01/25/2023  Well-controlled Associated with HTN, CHF and HLD On Jardiance 25 mg QD Advised to follow diabetic diet On statin and Spironolactone F/u CMP and lipid panel Diabetic eye exam: Advised to follow up with Ophthalmology for diabetic eye exam      Relevant Medications   empagliflozin (JARDIANCE) 25 MG TABS tablet   Other Relevant Orders   Urine Microalbumin w/creat. ratio   Hemoglobin A1c   CMP14+EGFR  POCT glycosylated hemoglobin (Hb A1C) (Completed)     Other   Cardiac pacemaker in situ    Since 1979, last changed in 2014 Follows EP Cardiology at [facility] On Eliquis      H/O Mustard procedure    H/o transposition of great aretries s/p Mustard surgery (1969) Follows up with Pediatric Cardiology and Cardiologist at [facility]      Mixed hyperlipidemia    On Crestor Check lipid profile Advised to follow DASH diet      Relevant Orders   Lipid panel   GAD (generalized anxiety disorder)    Likely work-related stress Had get BH therapy from her workplace Avoid any anxiolytic for now to avoid interaction with her  cardiac medications Since retirement, now improved      Other Visit Diagnoses     Chronic anticoagulation       Relevant Orders   CBC with Differential/Platelet   Vitamin D deficiency       Relevant Orders   VITAMIN D 25 Hydroxy (Vit-D Deficiency, Fractures)      No orders of the defined types were placed in this encounter.    Follow-up: Return in about 6 months (around 07/27/2023) for Annual physical.    Anabel Halon, MD

## 2023-01-25 NOTE — Assessment & Plan Note (Signed)
On Crestor Check lipid profile Advised to follow DASH diet 

## 2023-01-25 NOTE — Assessment & Plan Note (Signed)
Currently euvolemic Followed by Cardiology at [facility]

## 2023-01-25 NOTE — Assessment & Plan Note (Addendum)
Lab Results  Component Value Date   HGBA1C 6.1 01/25/2023   Well-controlled Associated with HTN, CHF and HLD On Jardiance 25 mg QD Advised to follow diabetic diet On statin and Spironolactone F/u CMP and lipid panel Diabetic eye exam: Advised to follow up with Ophthalmology for diabetic eye exam

## 2023-01-25 NOTE — Assessment & Plan Note (Signed)
BP Readings from Last 1 Encounters:  01/25/23 116/62   Well-controlled with Metoprolol and Spironolactone Counseled for compliance with the medications Advised DASH diet and moderate exercise/walking, at least 150 mins/week

## 2023-01-25 NOTE — Patient Instructions (Signed)
Please continue to take medications as prescribed.  Please continue to follow low carb diet and perform moderate exercise/walking at least 150 mins/week.  Please get fasting blood tests done before the next visit. 

## 2023-01-25 NOTE — Assessment & Plan Note (Signed)
Likely work-related stress Had get BH therapy from her workplace Avoid any anxiolytic for now to avoid interaction with her cardiac medications Since retirement, now improved

## 2023-01-25 NOTE — Assessment & Plan Note (Deleted)
S/p Cardioversion in 02/2020 On Metoprolol, Digoxin and Eliquis

## 2023-01-25 NOTE — Assessment & Plan Note (Signed)
Since 1979, last changed in 2014 Follows EP Cardiology at [facility] On Eliquis

## 2023-01-25 NOTE — Assessment & Plan Note (Signed)
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 

## 2023-01-27 LAB — MICROALBUMIN / CREATININE URINE RATIO
Creatinine, Urine: 35.3 mg/dL
Microalb/Creat Ratio: 52 mg/g creat — ABNORMAL HIGH (ref 0–29)
Microalbumin, Urine: 18.5 ug/mL

## 2023-01-28 ENCOUNTER — Ambulatory Visit: Payer: 59 | Admitting: Internal Medicine

## 2023-01-28 ENCOUNTER — Other Ambulatory Visit: Payer: Self-pay | Admitting: Internal Medicine

## 2023-01-28 DIAGNOSIS — E782 Mixed hyperlipidemia: Secondary | ICD-10-CM

## 2023-02-01 ENCOUNTER — Ambulatory Visit: Payer: 59 | Admitting: Internal Medicine

## 2023-02-09 ENCOUNTER — Ambulatory Visit (HOSPITAL_COMMUNITY): Payer: 59 | Admitting: Clinical

## 2023-02-09 DIAGNOSIS — F4322 Adjustment disorder with anxiety: Secondary | ICD-10-CM | POA: Diagnosis not present

## 2023-02-09 NOTE — Progress Notes (Signed)
IN PERSON   I connected with Brandi Fischer on 02/09/23 at 11:00AM in person and verified that I am speaking with the correct person using two identifiers.   Location: Patient: Office Provider: Office   I discussed the limitations of evaluation and management by telemedicine and the availability of in person appointments. The patient expressed understanding and agreed to proceed.( IN PERSON)   Therapy Progress Note   Session Time: 11:00AM-11:55AM   Participation Level: Active   Behavioral Response: CasualAlertDepressed   Type of Therapy: Individual Therapy   Treatment Goals addressed: Diagnosis: MDD and GAD   Interventions: CBT, Motivational Interviewing, Solution Focused, Strength-based and Supportive   Summary: Brandi Fischer is a 57 y.o. female who presents  with Adjustment Disorder with Anxiety. The OPT therapist worked with the patient for her OPT session. The OPT therapist utilized Motivational Interviewing to assist in creating therapeutic repore. The patient in the session was engaged and work in collaboration giving feedback about her triggers and symptoms over the past few weeks. The patient spoke about her ongoing adjustment post retiring due to the stress level of her job and her existing heart condition. The patient spoke about as more time passes feeling more assured about her decision to retire early. 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 The patient spoke about her ongoing progress of her adjustment during transition post retirement .The patient worked with the OPT therapist in the session on self check-ins, time management, and self care.    Suicidal/Homicidal:  Nowithout intent/plan   Therapist Response: The OPT therapist worked with the patient for the patients ongoing OPT treatment. The patient was engaged in her session and gave feedback in relation to triggers, symptoms, and behavior responses over the past few weeks. The OPT therapist worked with the patient utilizing an in session Cognitive Behavioral Therapy exercise. The patient verbalized, " I think not as there is more time since I retired I am feeling better about making the decision more convinced it was the right decision". The patient noted that she feels the past few weeks have been better with her stress lower since retiring. The patient has stayed busy filling her time with outreach and community service involvement. The patient has been able to purchase health insurance as this was previously covered by her employer and post retiring was a need for the patient.. The OPT therapist will continue treatment work with the patient in her next scheduled session.   Plan: Return again in 2/3 weeks.   Diagnosis:      Axis I: Adjustment Disorder with Anxiety                         Axis II: No diagnosis   I discussed the assessment and treatment plan with the patient. The patient was provided an opportunity to ask questions and all were answered. The patient agreed with the plan and demonstrated an understanding of the instructions.   The patient was advised to call back or seek an in-person evaluation if the symptoms worsen or if the condition fails to improve as anticipated.   I provided 55 minutes of face-to-face time during this encounter.   Koren Shiver  Sherea Liptak,LCSW   02/09/2023

## 2023-02-11 ENCOUNTER — Other Ambulatory Visit: Payer: Self-pay

## 2023-02-11 ENCOUNTER — Other Ambulatory Visit: Payer: Self-pay | Admitting: Internal Medicine

## 2023-02-11 ENCOUNTER — Telehealth: Payer: Self-pay | Admitting: Internal Medicine

## 2023-02-11 DIAGNOSIS — I10 Essential (primary) hypertension: Secondary | ICD-10-CM

## 2023-02-11 DIAGNOSIS — R011 Cardiac murmur, unspecified: Secondary | ICD-10-CM

## 2023-02-11 MED ORDER — METOPROLOL SUCCINATE ER 25 MG PO TB24
ORAL_TABLET | ORAL | 1 refills | Status: DC
Start: 2023-02-11 — End: 2023-08-09

## 2023-02-11 NOTE — Telephone Encounter (Signed)
Pt called and said she does not use [facility] anymore, [facility] [address] [facility]. She requested for her prescription be resent there.   metoprolol succinate (TOPROL-XL) 25 MG 24 hr tablet [[id number]

## 2023-02-11 NOTE — Telephone Encounter (Signed)
Refills sent to pharmacy. 

## 2023-02-16 NOTE — Telephone Encounter (Signed)
Error

## 2023-02-17 DIAGNOSIS — Z950 Presence of cardiac pacemaker: Secondary | ICD-10-CM | POA: Diagnosis not present

## 2023-02-24 DIAGNOSIS — Z950 Presence of cardiac pacemaker: Secondary | ICD-10-CM | POA: Diagnosis not present

## 2023-03-07 ENCOUNTER — Other Ambulatory Visit: Payer: Self-pay

## 2023-03-07 ENCOUNTER — Telehealth: Payer: Self-pay | Admitting: Internal Medicine

## 2023-03-07 MED ORDER — SPIRONOLACTONE 25 MG PO TABS: 25.0000 mg | ORAL_TABLET | Freq: Every day | ORAL | 0 refills | Status: DC

## 2023-03-07 NOTE — Telephone Encounter (Signed)
Refill sent. Patient aware.  

## 2023-03-07 NOTE — Telephone Encounter (Signed)
Prescription Request  03/07/2023  LOV: 01/25/2023  What is the name of the medication or equipment?  spironolactone (ALDACTONE) 25 MG tablet   Have you contacted your pharmacy to request a refill? Yes   Which pharmacy would you like this sent to?   [id number] [place], [place] - [address] AT [facility] [address] [place] [address] Phone: [phone] Fax: [phone]    Patient notified that their request is being sent to the clinical staff for review and that they should receive a response within 2 business days.   Please advise at [facility] [phone]

## 2023-03-14 DIAGNOSIS — I484 Atypical atrial flutter: Secondary | ICD-10-CM | POA: Diagnosis not present

## 2023-03-14 DIAGNOSIS — Z950 Presence of cardiac pacemaker: Secondary | ICD-10-CM | POA: Diagnosis not present

## 2023-03-14 DIAGNOSIS — I499 Cardiac arrhythmia, unspecified: Secondary | ICD-10-CM | POA: Diagnosis not present

## 2023-03-16 ENCOUNTER — Telehealth (HOSPITAL_COMMUNITY): Payer: Self-pay | Admitting: Clinical

## 2023-03-16 ENCOUNTER — Ambulatory Visit (HOSPITAL_COMMUNITY): Payer: 59 | Admitting: Clinical

## 2023-03-16 NOTE — Telephone Encounter (Signed)
Patient was a no show for this in person scheduled appointment.  

## 2023-03-21 ENCOUNTER — Ambulatory Visit (HOSPITAL_COMMUNITY): Payer: 59 | Admitting: Clinical

## 2023-03-21 DIAGNOSIS — F4322 Adjustment disorder with anxiety: Secondary | ICD-10-CM | POA: Diagnosis not present

## 2023-03-21 NOTE — Progress Notes (Signed)
Virtual Visit via Telephone Note  I connected with Brandi Fischer on 03/21/23 at  2:00 PM EDT by telephone and verified that I am speaking with the correct person using two identifiers.  Location: Patient: home Provider: office   I discussed the limitations, risks, security and privacy concerns of performing an evaluation and management service by telephone and the availability of in person appointments. I also discussed with the patient that there may be a patient responsible charge related to this service. The patient expressed understanding and agreed to proceed.  Therapy Progress Note   Session Time: 2:00 PM - 2:20 PM   Participation Level: Active   Behavioral Response: CasualAlertDepressed   Type of Therapy: Individual Therapy   Treatment Goals addressed: Diagnosis: MDD and GAD   Interventions: CBT, Motivational Interviewing, Solution Focused, Strength-based and Supportive   Summary: Brandi Fischer is a 57 y.o. female who presents  with Adjustment Disorder with Anxiety. The OPT therapist worked with the patient for her OPT session. The OPT therapist utilized Motivational Interviewing to assist in creating therapeutic repore. The patient in the session was engaged and work in collaboration giving feedback about her triggers and symptoms over the past few weeks. The patient spoke about her ongoing adjustment post retiring due to the stress level of her job and her existing heart condition. The patient spoke about as more time passes feeling more assured about her decision to retire early. 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 The patient spoke about her ongoing progress of her  adjustment during transition post retirement .The patient worked with the OPT therapist in the session on self check-ins, time management, and self care. The OPT therapist worked with the patient overviewing the sequence of progress from start to today's session. The Opt and patient agree the patient has met her goals for Outpatient and will be a successful discharge.   Suicidal/Homicidal: Nowithout intent/plan   Therapist Response: The OPT therapist worked with the patient for the patients ongoing OPT treatment. The patient was engaged in her session and gave feedback in relation to triggers, symptoms, and behavior responses over the past few weeks. The OPT therapist worked with the patient utilizing an in session Cognitive Behavioral Therapy exercise. The patient verbalized, " I am feeling adjusted now with retirement and my work with outreach has given me purpose and keeps me busy". The patient noted that she feels the past few weeks have been better with her stress lower since retiring. The patient has stayed busy filling her time with outreach and community service involvement. The patient has been able to purchase health insurance as this was previously covered by her employer and post retiring was a need for the patient.. The OPT therapist agreed at this time with the patient meeting her plan goals for successful discharge.   Plan: Successful Discharge.   Diagnosis:      Axis I: Adjustment Disorder with Anxiety                         Axis II: No diagnosis   I discussed the assessment and treatment plan with the patient. The patient was provided an opportunity to ask questions and  all were answered. The patient agreed with the plan and demonstrated an understanding of the instructions.   The patient was advised to call back or seek an in-person evaluation if the symptoms worsen or if the condition fails to improve as anticipated.   I provided 20 minutes of non-face-to-face time during this  encounter.   Aurther Loft T Arslan Kier,LCSW   03/21/2023

## 2023-04-11 IMAGING — US US ABDOMEN LIMITED RUQ/ASCITES
1 series · 13 of 25 positions shown · non-contrast
Comparison: CT abdomen and pelvis January 24, 2020

CLINICAL DATA: Abnormal appearing gallbladder on prior CT

EXAM:
ULTRASOUND ABDOMEN LIMITED RIGHT UPPER QUADRANT

[Series 1: us abdomen limited ruq/ascites · 13 of 82 slices shown]
[im 1/82]
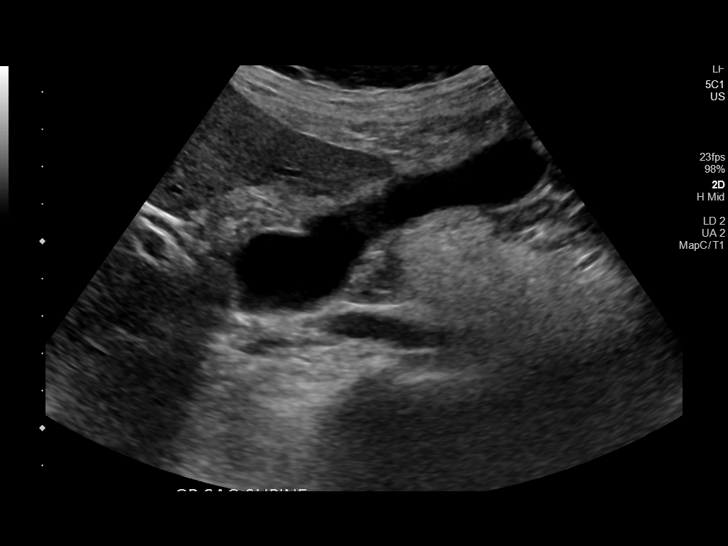
[im 7/82]
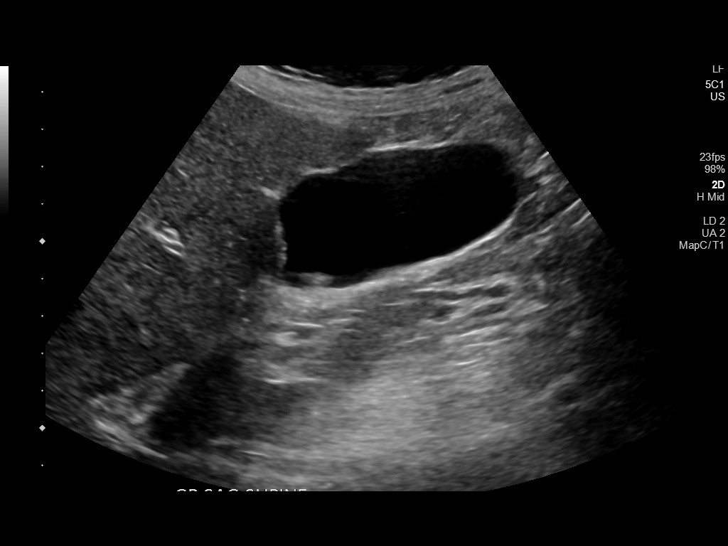
[im 14/82]
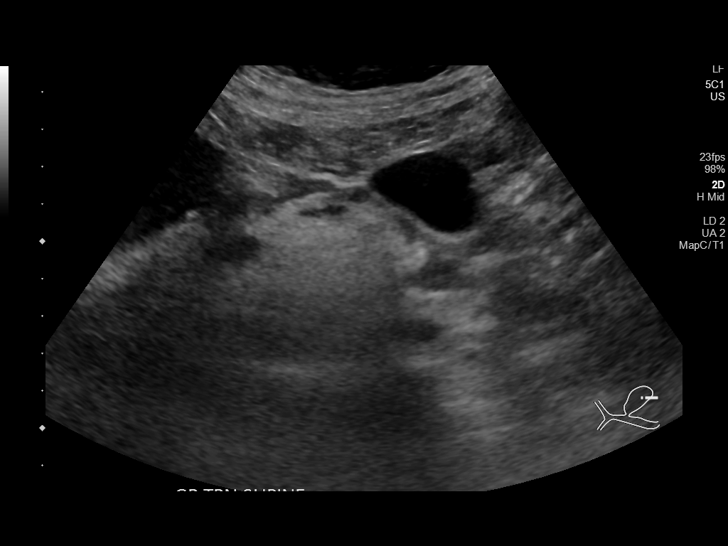
[im 21/82]
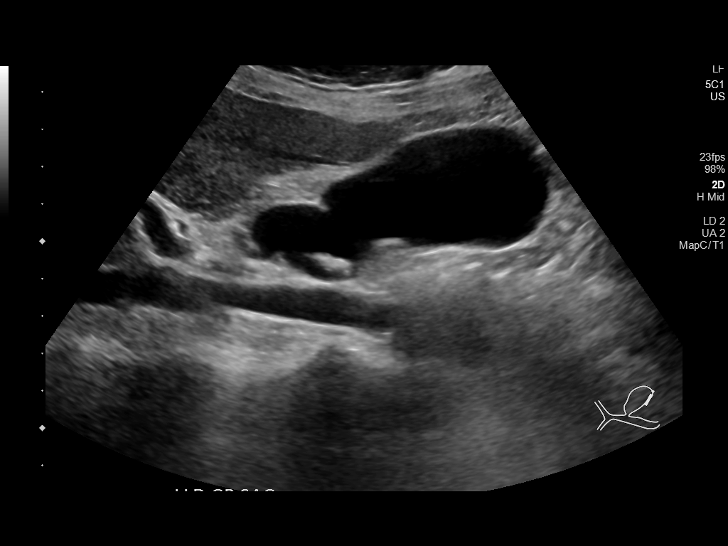
[im 28/82]
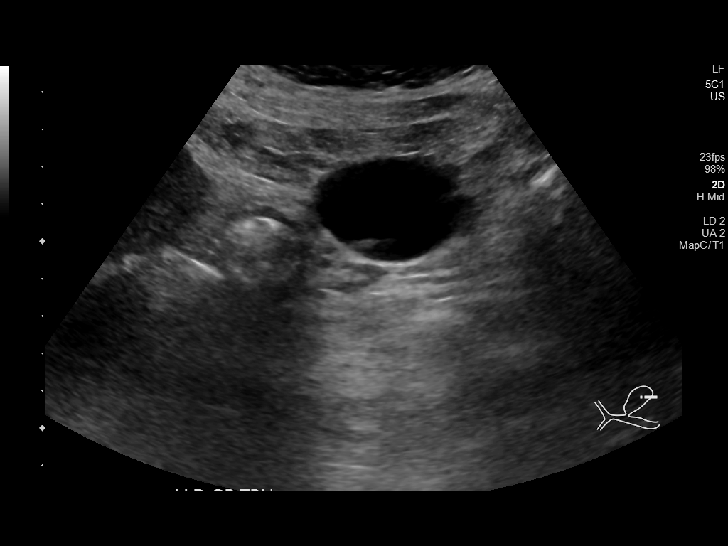
[im 34/82]
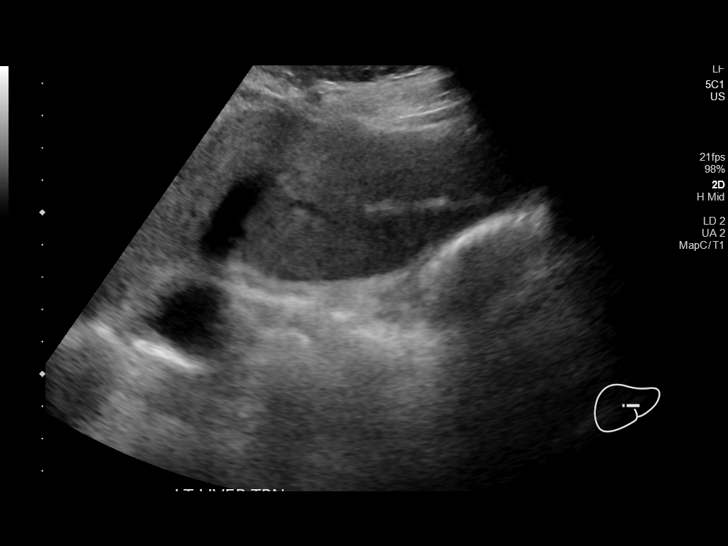
[im 41/82]
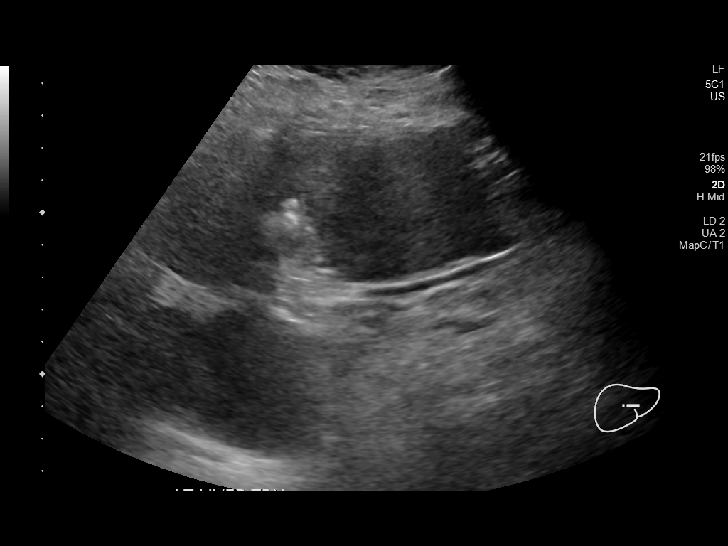
[im 48/82]
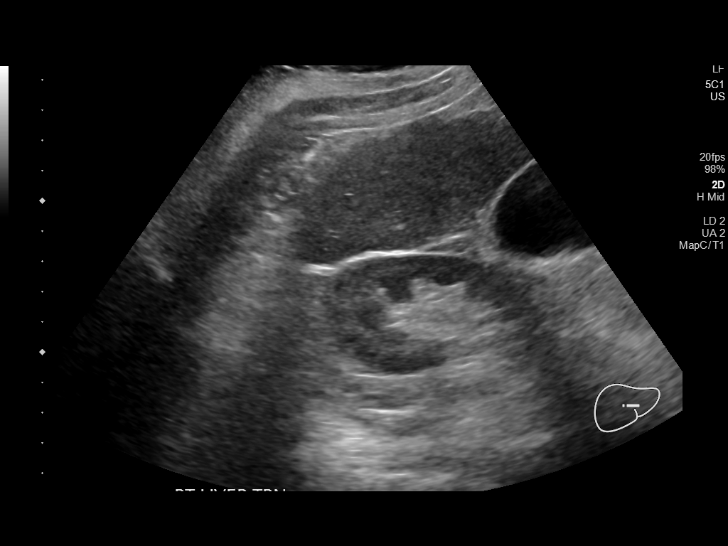
[im 55/82]
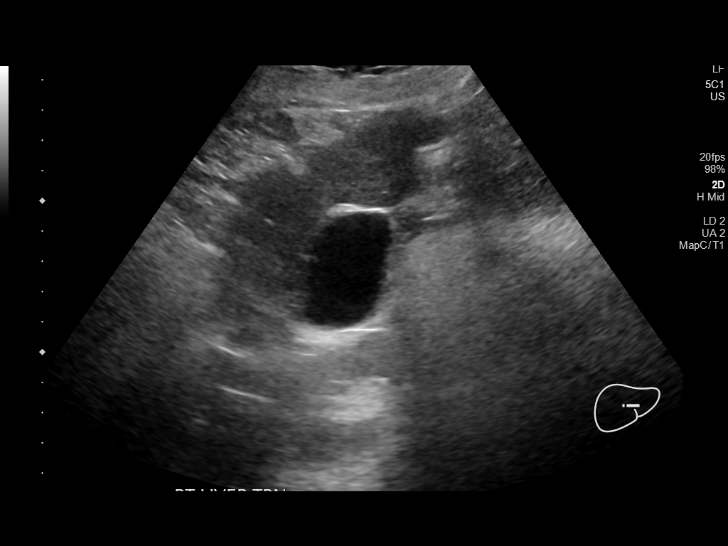
[im 61/82]
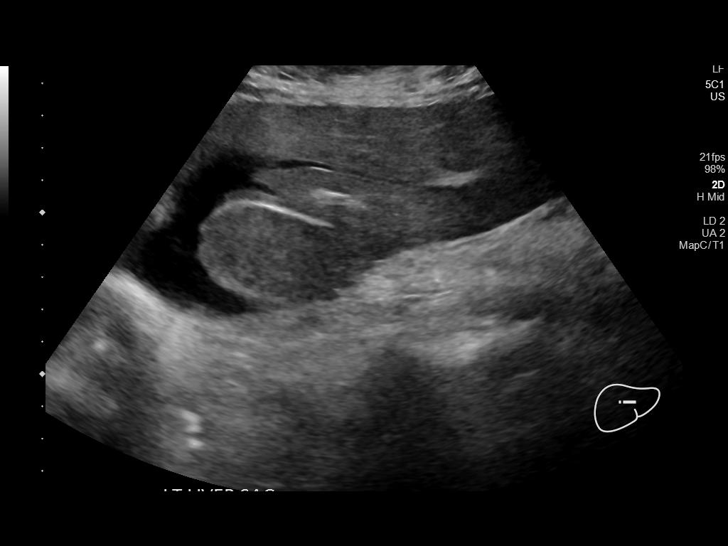
[im 68/82]
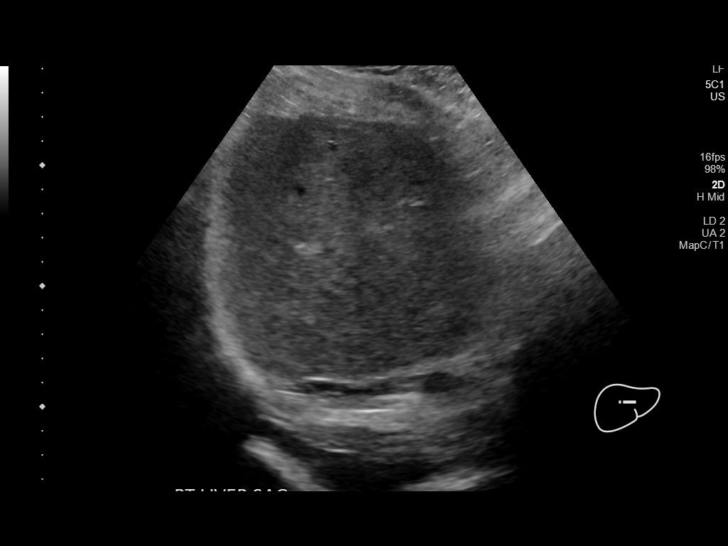
[im 75/82]
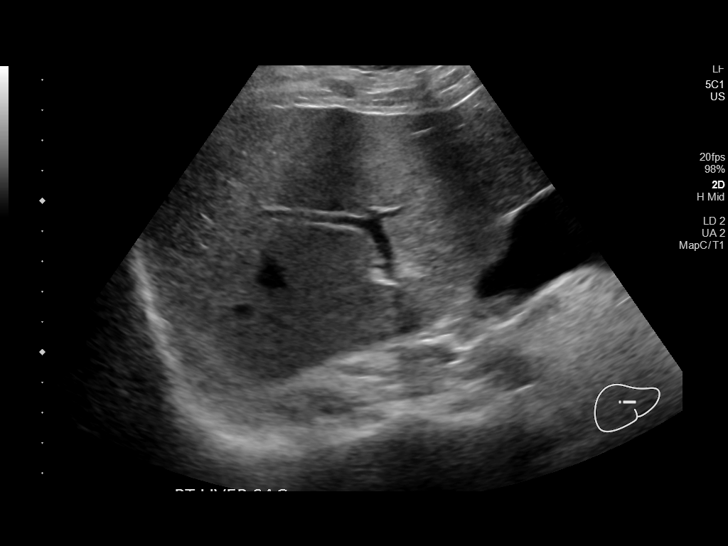
[im 82/82]
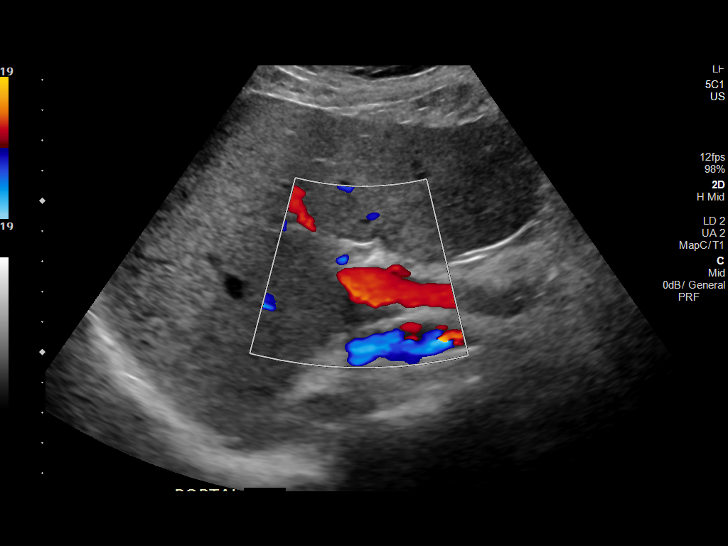

[13 of 25 positions shown; findings below may reference images not displayed]

FINDINGS: Gallbladder:

Within the gallbladder, there are echogenic foci which move and
shadow consistent with cholelithiasis. Largest gallstone measures
1.4 cm in length. There is no appreciable gallbladder wall
thickening or pericholecystic fluid. No sonographic Murphy sign
noted by sonographer.

Common bile duct:

Diameter: 5 mm. No intrahepatic or extrahepatic biliary duct
dilatation.

Liver:

There is an echogenic focus near the junction of the right and left
lobes of the liver measuring 1.4 x 1.2 x 1.2 cm. No other focal
liver lesion appreciable. Within normal limits in parenchymal
echogenicity. Portal vein is patent on color Doppler imaging with
normal direction of blood flow towards the liver.

Other: None.
IMPRESSION: 1. Cholelithiasis. No appreciable gallbladder wall thickening or
pericholecystic fluid.

2. Echogenic focus near the junction of the right and left lobes of
the liver, measuring 1.4 x 1.2 x 1.2 cm. Question focal hemangioma
in this area. This finding is felt to warrant a follow-up ultrasound
of the liver in 1 year to assess for stability. Liver otherwise
appears unremarkable.

## 2023-04-12 ENCOUNTER — Other Ambulatory Visit: Payer: Self-pay | Admitting: Internal Medicine

## 2023-05-05 ENCOUNTER — Telehealth: Payer: Self-pay

## 2023-05-05 DIAGNOSIS — K7460 Unspecified cirrhosis of liver: Secondary | ICD-10-CM

## 2023-05-05 NOTE — Telephone Encounter (Signed)
The pt has been advised and agrees to labs and [place].  Orders entered and [place] order sent to the schedulers.

## 2023-05-05 NOTE — Telephone Encounter (Signed)
Repeat RUQ [place] in 6 months

## 2023-05-05 NOTE — Telephone Encounter (Signed)
-----   Message from Nurse Zuercher Singer sent at 05/05/2023  8:31 AM EDT -----  ----- Message ----- From: Loretha Stapler, RN Sent: 05/03/2023  12:00 AM EDT To: Lucky Cowboy, RN  Repeat CBC, CMP, PT/INR, AFP in 6 months

## 2023-05-10 DIAGNOSIS — E785 Hyperlipidemia, unspecified: Secondary | ICD-10-CM | POA: Diagnosis not present

## 2023-05-10 DIAGNOSIS — I4891 Unspecified atrial fibrillation: Secondary | ICD-10-CM | POA: Diagnosis not present

## 2023-05-10 DIAGNOSIS — Z7984 Long term (current) use of oral hypoglycemic drugs: Secondary | ICD-10-CM | POA: Diagnosis not present

## 2023-05-10 DIAGNOSIS — I4892 Unspecified atrial flutter: Secondary | ICD-10-CM | POA: Diagnosis not present

## 2023-05-10 DIAGNOSIS — I110 Hypertensive heart disease with heart failure: Secondary | ICD-10-CM | POA: Diagnosis not present

## 2023-05-10 DIAGNOSIS — Z7901 Long term (current) use of anticoagulants: Secondary | ICD-10-CM | POA: Diagnosis not present

## 2023-05-10 DIAGNOSIS — E1165 Type 2 diabetes mellitus with hyperglycemia: Secondary | ICD-10-CM | POA: Diagnosis not present

## 2023-05-10 DIAGNOSIS — Z950 Presence of cardiac pacemaker: Secondary | ICD-10-CM | POA: Diagnosis not present

## 2023-05-10 DIAGNOSIS — Z823 Family history of stroke: Secondary | ICD-10-CM | POA: Diagnosis not present

## 2023-05-10 DIAGNOSIS — Z91013 Allergy to seafood: Secondary | ICD-10-CM | POA: Diagnosis not present

## 2023-05-10 DIAGNOSIS — Z008 Encounter for other general examination: Secondary | ICD-10-CM | POA: Diagnosis not present

## 2023-05-10 DIAGNOSIS — I509 Heart failure, unspecified: Secondary | ICD-10-CM | POA: Diagnosis not present

## 2023-05-10 DIAGNOSIS — D6869 Other thrombophilia: Secondary | ICD-10-CM | POA: Diagnosis not present

## 2023-05-16 ENCOUNTER — Ambulatory Visit (HOSPITAL_COMMUNITY)
Admission: RE | Admit: 2023-05-16 | Discharge: 2023-05-16 | Disposition: A | Discharge status: Home / Self Care | Payer: 59 | Source: Ambulatory Visit | Attending: Gastroenterology | Admitting: Gastroenterology

## 2023-05-16 ENCOUNTER — Other Ambulatory Visit: Payer: Self-pay

## 2023-05-16 ENCOUNTER — Other Ambulatory Visit: Payer: 59

## 2023-05-16 DIAGNOSIS — K7689 Other specified diseases of liver: Secondary | ICD-10-CM | POA: Diagnosis not present

## 2023-05-16 DIAGNOSIS — K8020 Calculus of gallbladder without cholecystitis without obstruction: Secondary | ICD-10-CM | POA: Diagnosis not present

## 2023-05-16 DIAGNOSIS — K7460 Unspecified cirrhosis of liver: Secondary | ICD-10-CM | POA: Diagnosis not present

## 2023-05-17 ENCOUNTER — Other Ambulatory Visit (INDEPENDENT_AMBULATORY_CARE_PROVIDER_SITE_OTHER): Payer: 59

## 2023-05-17 ENCOUNTER — Other Ambulatory Visit: Payer: Self-pay

## 2023-05-17 DIAGNOSIS — K7460 Unspecified cirrhosis of liver: Secondary | ICD-10-CM

## 2023-05-17 LAB — COMPREHENSIVE METABOLIC PANEL
ALT: 20 U/L (ref 0–35)
AST: 21 U/L (ref 0–37)
Albumin: 4.9 g/dL (ref 3.5–5.2)
Alkaline Phosphatase: 67 U/L (ref 39–117)
BUN: 16 mg/dL (ref 6–23)
CO2: 30 mEq/L (ref 19–32)
Calcium: 9.6 mg/dL (ref 8.4–10.5)
Chloride: 95 mEq/L — ABNORMAL LOW (ref 96–112)
Creatinine, Ser: 0.88 mg/dL (ref 0.40–1.20)
GFR: 72.89 mL/min (ref >60.00)
Glucose, Bld: 161 mg/dL — ABNORMAL HIGH (ref 70–99)
Potassium: 4.5 mEq/L (ref 3.5–5.1)
Sodium: 133 mEq/L — ABNORMAL LOW (ref 135–145)
Total Bilirubin: 0.7 mg/dL (ref 0.2–1.2)
Total Protein: 7.6 g/dL (ref 6.0–8.3)

## 2023-05-17 LAB — PROTIME-INR
INR: 1.5 ratio — ABNORMAL HIGH (ref 0.8–1.0)
Prothrombin Time: 15.3 s — ABNORMAL HIGH (ref 9.6–13.1)

## 2023-05-17 LAB — CBC WITH DIFFERENTIAL/PLATELET
Basophils Absolute: 0.1 10*3/uL (ref 0.0–0.1)
Basophils Relative: 1.1 % (ref 0.0–3.0)
Eosinophils Absolute: 0.1 10*3/uL (ref 0.0–0.7)
Eosinophils Relative: 2 % (ref 0.0–5.0)
HCT: 46.4 % — ABNORMAL HIGH (ref 36.0–46.0)
Hemoglobin: 15.6 g/dL — ABNORMAL HIGH (ref 12.0–15.0)
Lymphocytes Relative: 21.6 % (ref 12.0–46.0)
Lymphs Abs: 1.4 10*3/uL (ref 0.7–4.0)
MCHC: 33.7 g/dL (ref 30.0–36.0)
MCV: 96.2 fl (ref 78.0–100.0)
Monocytes Absolute: 0.5 10*3/uL (ref 0.1–1.0)
Monocytes Relative: 7.8 % (ref 3.0–12.0)
Neutro Abs: 4.4 10*3/uL (ref 1.4–7.7)
Neutrophils Relative %: 67.5 % (ref 43.0–77.0)
Platelets: 120 10*3/uL — ABNORMAL LOW (ref 150.0–400.0)
RBC: 4.82 Mil/uL (ref 3.87–5.11)
RDW: 13.4 % (ref 11.5–15.5)
WBC: 6.5 10*3/uL (ref 4.0–10.5)

## 2023-05-19 LAB — AFP TUMOR MARKER: AFP-Tumor Marker: 3.2 ng/mL

## 2023-05-23 ENCOUNTER — Telehealth: Payer: Self-pay

## 2023-05-23 NOTE — Telephone Encounter (Signed)
Nothing else. The results came back to my inbox, not sure why

## 2023-05-23 NOTE — Telephone Encounter (Signed)
Noted  

## 2023-05-23 NOTE — Telephone Encounter (Signed)
-----   Message from [place] T. Russella Dar sent at 05/21/2023  7:26 PM EDT ----- CMP, CBC, AFP collected by not resulted 5 days later. What is the problem? ----- Message ----- From: SYSTEM Sent: 05/21/2023  12:13 AM EDT To: Meryl Dare, MD

## 2023-05-23 NOTE — Telephone Encounter (Signed)
I see that the results are in from 05/17/23 and  you reviewed and commented.  I have entered the orders to be rechecked in 6 months.  Are there others you are looking at?

## 2023-05-24 DIAGNOSIS — Z01419 Encounter for gynecological examination (general) (routine) without abnormal findings: Secondary | ICD-10-CM | POA: Diagnosis not present

## 2023-05-24 DIAGNOSIS — Z1231 Encounter for screening mammogram for malignant neoplasm of breast: Secondary | ICD-10-CM | POA: Diagnosis not present

## 2023-05-24 DIAGNOSIS — Z6823 Body mass index (BMI) 23.0-23.9, adult: Secondary | ICD-10-CM | POA: Diagnosis not present

## 2023-05-24 DIAGNOSIS — Z13820 Encounter for screening for osteoporosis: Secondary | ICD-10-CM | POA: Diagnosis not present

## 2023-05-26 DIAGNOSIS — I370 Nonrheumatic pulmonary valve stenosis: Secondary | ICD-10-CM | POA: Diagnosis not present

## 2023-05-26 DIAGNOSIS — Q203 Discordant ventriculoarterial connection: Secondary | ICD-10-CM | POA: Diagnosis not present

## 2023-05-26 DIAGNOSIS — I484 Atypical atrial flutter: Secondary | ICD-10-CM | POA: Diagnosis not present

## 2023-05-26 DIAGNOSIS — I480 Paroxysmal atrial fibrillation: Secondary | ICD-10-CM | POA: Diagnosis not present

## 2023-05-26 DIAGNOSIS — I5022 Chronic systolic (congestive) heart failure: Secondary | ICD-10-CM | POA: Diagnosis not present

## 2023-05-31 ENCOUNTER — Other Ambulatory Visit: Payer: Self-pay | Admitting: Internal Medicine

## 2023-06-28 DIAGNOSIS — Q203 Discordant ventriculoarterial connection: Secondary | ICD-10-CM | POA: Diagnosis not present

## 2023-07-09 ENCOUNTER — Other Ambulatory Visit: Payer: Self-pay | Admitting: Internal Medicine

## 2023-07-09 DIAGNOSIS — E782 Mixed hyperlipidemia: Secondary | ICD-10-CM

## 2023-08-01 DIAGNOSIS — E1169 Type 2 diabetes mellitus with other specified complication: Secondary | ICD-10-CM | POA: Diagnosis not present

## 2023-08-01 DIAGNOSIS — E559 Vitamin D deficiency, unspecified: Secondary | ICD-10-CM | POA: Diagnosis not present

## 2023-08-01 DIAGNOSIS — Z7901 Long term (current) use of anticoagulants: Secondary | ICD-10-CM | POA: Diagnosis not present

## 2023-08-01 DIAGNOSIS — E782 Mixed hyperlipidemia: Secondary | ICD-10-CM | POA: Diagnosis not present

## 2023-08-01 DIAGNOSIS — I480 Paroxysmal atrial fibrillation: Secondary | ICD-10-CM | POA: Diagnosis not present

## 2023-08-02 LAB — TSH: TSH: 2.74 u[IU]/mL (ref 0.450–4.500)

## 2023-08-02 LAB — CBC WITH DIFFERENTIAL/PLATELET
Basophils Absolute: 0.1 10*3/uL (ref 0.0–0.2)
Basos: 1 %
EOS (ABSOLUTE): 0.2 10*3/uL (ref 0.0–0.4)
Eos: 3 %
Hematocrit: 49.7 % — ABNORMAL HIGH (ref 34.0–46.6)
Hemoglobin: 16 g/dL — ABNORMAL HIGH (ref 11.1–15.9)
Immature Grans (Abs): 0 10*3/uL (ref 0.0–0.1)
Immature Granulocytes: 0 %
Lymphocytes Absolute: 1.3 10*3/uL (ref 0.7–3.1)
Lymphs: 20 %
MCH: 31.6 pg (ref 26.6–33.0)
MCHC: 32.2 g/dL (ref 31.5–35.7)
MCV: 98 fL — ABNORMAL HIGH (ref 79–97)
Monocytes Absolute: 0.5 10*3/uL (ref 0.1–0.9)
Monocytes: 8 %
Neutrophils Absolute: 4.6 10*3/uL (ref 1.4–7.0)
Neutrophils: 68 %
Platelets: 135 10*3/uL — ABNORMAL LOW (ref 150–450)
RBC: 5.07 x10E6/uL (ref 3.77–5.28)
RDW: 12.5 % (ref 11.7–15.4)
WBC: 6.7 10*3/uL (ref 3.4–10.8)

## 2023-08-02 LAB — LIPID PANEL
Chol/HDL Ratio: 3.5 {ratio} (ref 0.0–4.4)
Cholesterol, Total: 135 mg/dL (ref 100–199)
HDL: 39 mg/dL — ABNORMAL LOW (ref >39)
LDL Chol Calc (NIH): 67 mg/dL (ref 0–99)
Triglycerides: 173 mg/dL — ABNORMAL HIGH (ref 0–149)
VLDL Cholesterol Cal: 29 mg/dL (ref 5–40)

## 2023-08-02 LAB — CMP14+EGFR
ALT: 22 [IU]/L (ref 0–32)
AST: 25 [IU]/L (ref 0–40)
Albumin: 4.8 g/dL (ref 3.8–4.9)
Alkaline Phosphatase: 93 [IU]/L (ref 44–121)
BUN/Creatinine Ratio: 23 (ref 9–23)
BUN: 20 mg/dL (ref 6–24)
Bilirubin Total: 0.6 mg/dL (ref 0.0–1.2)
CO2: 22 mmol/L (ref 20–29)
Calcium: 9.7 mg/dL (ref 8.7–10.2)
Chloride: 101 mmol/L (ref 96–106)
Creatinine, Ser: 0.88 mg/dL (ref 0.57–1.00)
Globulin, Total: 2.6 g/dL (ref 1.5–4.5)
Glucose: 132 mg/dL — ABNORMAL HIGH (ref 70–99)
Potassium: 4.1 mmol/L (ref 3.5–5.2)
Sodium: 140 mmol/L (ref 134–144)
Total Protein: 7.4 g/dL (ref 6.0–8.5)
eGFR: 77 mL/min/{1.73_m2} (ref >59)

## 2023-08-02 LAB — HEMOGLOBIN A1C
Est. average glucose Bld gHb Est-mCnc: 146 mg/dL
Hgb A1c MFr Bld: 6.7 % — ABNORMAL HIGH (ref 4.8–5.6)

## 2023-08-02 LAB — VITAMIN D 25 HYDROXY (VIT D DEFICIENCY, FRACTURES): Vit D, 25-Hydroxy: 43.3 ng/mL (ref 30.0–100.0)

## 2023-08-09 ENCOUNTER — Encounter: Payer: Self-pay | Admitting: Internal Medicine

## 2023-08-09 ENCOUNTER — Ambulatory Visit (INDEPENDENT_AMBULATORY_CARE_PROVIDER_SITE_OTHER): Payer: 59 | Admitting: Internal Medicine

## 2023-08-09 VITALS — BP 105/65 | HR 90 | Ht 61.5 in | Wt 127.2 lb

## 2023-08-09 DIAGNOSIS — Z7984 Long term (current) use of oral hypoglycemic drugs: Secondary | ICD-10-CM | POA: Diagnosis not present

## 2023-08-09 DIAGNOSIS — Z950 Presence of cardiac pacemaker: Secondary | ICD-10-CM

## 2023-08-09 DIAGNOSIS — E782 Mixed hyperlipidemia: Secondary | ICD-10-CM | POA: Diagnosis not present

## 2023-08-09 DIAGNOSIS — E1169 Type 2 diabetes mellitus with other specified complication: Secondary | ICD-10-CM | POA: Diagnosis not present

## 2023-08-09 DIAGNOSIS — I10 Essential (primary) hypertension: Secondary | ICD-10-CM | POA: Diagnosis not present

## 2023-08-09 DIAGNOSIS — D696 Thrombocytopenia, unspecified: Secondary | ICD-10-CM | POA: Diagnosis not present

## 2023-08-09 DIAGNOSIS — Z8774 Personal history of (corrected) congenital malformations of heart and circulatory system: Secondary | ICD-10-CM | POA: Diagnosis not present

## 2023-08-09 DIAGNOSIS — I5022 Chronic systolic (congestive) heart failure: Secondary | ICD-10-CM | POA: Diagnosis not present

## 2023-08-09 DIAGNOSIS — Z23 Encounter for immunization: Secondary | ICD-10-CM | POA: Diagnosis not present

## 2023-08-09 DIAGNOSIS — I484 Atypical atrial flutter: Secondary | ICD-10-CM | POA: Diagnosis not present

## 2023-08-09 MED ORDER — SPIRONOLACTONE 25 MG PO TABS
25.0000 mg | ORAL_TABLET | Freq: Every day | ORAL | 5 refills | Status: DC
Start: 2023-08-09 — End: 2024-07-18

## 2023-08-09 MED ORDER — METOPROLOL SUCCINATE ER 25 MG PO TB24: 25.0000 mg | ORAL_TABLET | Freq: Every day | ORAL | 1 refills | Status: DC

## 2023-08-09 NOTE — Patient Instructions (Signed)
Please continue to take medications as prescribed. ? ?Please continue to follow low carb diet and perform moderate exercise/walking at least 150 mins/week. ?

## 2023-08-09 NOTE — Assessment & Plan Note (Signed)
Since 1979, last changed in 2014 Follows EP Cardiology at [facility] On Eliquis

## 2023-08-09 NOTE — Assessment & Plan Note (Signed)
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 

## 2023-08-09 NOTE — Progress Notes (Signed)
Established Patient Office Visit  Subjective:  Patient ID: Brandi Fischer, female    DOB: 21-Jan-1966  Age: 57 y.o. MRN: [id number]  CC:  Chief Complaint  Patient presents with   Congestive Heart Failure   Diabetes    HPI Brandi Fischer is a 57 y.o. 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  DM: Her HbA1c is 6.7 now.  She is taking Jardiance 25 mg QD.  Denies polyuria or polyphagia currently.  GAD: She has retired now and has been feeling better now. She had been experiencing work-related stress and overwhelmed at times.  She had spells of severe anxiety, but denies any anhedonia, SI or HI currently.  Due to work related stress, she had labile blood pressure and palpitations.  She has felt better since being off of work. She has tried [facility] therapy sessions and reports benefiting from it.  CHF: She has been seeing Dr Lyndel Safe for her congenital heart defect.  She takes Jardiance 25 mg QD, spironolactone 25 mg QD and Lasix 40 mg QOD.  Denies any headache, dizziness, chest pain, dyspnea or palpitations.  She is planned to see Dr. Alden Hipp for AICD placement.  Atrial flutter: Followed by Dr. Sharol Harness.  She is on Toprol, digoxin and Eliquis currently.  Denies any easy bruising or active bleeding.  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 grade   Unspecified chronic conjunctivitis, left eye     Past Surgical History:  Procedure Laterality Date   ABDOMINAL HYSTERECTOMY     APPENDECTOMY  10/2010   cardiac  pacemaker procedure     CARDIAC SURGERY     TUBAL LIGATION      Family History  Problem Relation Age of Onset   Stroke Father    Diabetes Maternal Grandfather    Diabetes Paternal Grandmother    Breast cancer Paternal Grandmother    Breast cancer Paternal Aunt    Colon cancer Neg Hx    Esophageal cancer Neg Hx    Rectal cancer Neg Hx    Stomach cancer Neg Hx     Social History   Socioeconomic History   Marital status: Married    Spouse name: Not on file   Number of children: Not on file   Years of education: Not on file   Highest education level: Not on file  Occupational History   Not on file  Tobacco Use   Smoking status: Never   Smokeless tobacco: Never  Vaping Use   Vaping status: Never Used  Substance and Sexual Activity   Alcohol use: No   Drug use: No   Sexual activity: Not on file  Other Topics Concern   Not on file  Social History Narrative   Not on file   Social Determinants of Health   Financial Resource Strain: Not on file  Food Insecurity: Not on file  Transportation Needs: Not on file  Physical Activity: Not on file  Stress: Not on file  Social  Connections: Not on file  Intimate Partner Violence: Not on file    Outpatient Medications Prior to Visit  Medication Sig Dispense Refill   calcium-vitamin D (OSCAL WITH D) 500-200 MG-UNIT per tablet Take 1 tablet by mouth.     digoxin (LANOXIN) 0.125 MG tablet TAKE 1 TABLET BY MOUTH EVERY DAY 30 tablet 2   ELIQUIS 5 MG TABS tablet Take 5 mg by mouth 2 (two) times daily.     empagliflozin (JARDIANCE) 25 MG TABS tablet Take 25 mg by mouth daily.     furosemide (LASIX) 40 MG tablet TAKE 1 TABLET(40 MG) BY MOUTH DAILY 90 tablet 1   levalbuterol (XOPENEX HFA) 45 MCG/ACT inhaler Inhale 1 puff into the lungs every 6 (six) hours as needed for wheezing. 1 each 12   Magnesium (V-R MAGNESIUM) 250 MG TABS Take 250 mg by mouth daily.      Multiple Vitamin (MULTIVITAMIN PO) Take 1 tablet by mouth daily.      rosuvastatin (CRESTOR) 5 MG tablet TAKE 1 TABLET BY MOUTH EVERY DAY 30 tablet 5   triamcinolone (KENALOG) 0.025 % ointment Apply 1 Application topically 2 (two) times daily. 30 g 0   metoprolol succinate (TOPROL-XL) 25 MG 24 hr tablet TAKE 1 TABLET(25 MG) BY MOUTH DAILY 90 tablet 1   spironolactone (ALDACTONE) 25 MG tablet TAKE 1 TABLET (25 MG TOTAL) BY MOUTH DAILY. 30 tablet 2   0.9 %  sodium chloride infusion      No facility-administered medications prior to visit.    Allergies  Allergen Reactions   Shellfish Allergy     Hives    ROS Review of Systems  Constitutional:  Negative for chills and fever.  HENT:  Negative for congestion, sinus pressure, sinus pain and sore throat.   Eyes:  Negative for pain and discharge.  Respiratory:  Negative for cough and shortness of breath.   Cardiovascular:  Negative for chest pain, palpitations and leg swelling.  Gastrointestinal:  Negative for abdominal pain, diarrhea, nausea and vomiting.  Endocrine: Negative for polydipsia and polyuria.  Genitourinary:  Negative for dysuria and hematuria.  Musculoskeletal:  Negative for neck pain and neck stiffness.  Skin:  Negative for rash.  Neurological:  Negative for dizziness, syncope, weakness and numbness.  Psychiatric/Behavioral:  Negative for agitation and behavioral problems.       Objective:    Physical Exam Vitals reviewed.  Constitutional:      General: She is not in acute distress.    Appearance: She is not diaphoretic.  HENT:     Head: Normocephalic and atraumatic.     Nose: Nose normal. No congestion.     Mouth/Throat:     Mouth: Mucous membranes are moist.     Pharynx: No posterior oropharyngeal erythema.  Eyes:     General: No scleral icterus.    Extraocular Movements: Extraocular movements intact.  Cardiovascular:     Rate and Rhythm: Normal rate and regular rhythm.     Pulses: Normal pulses.     Heart sounds: Murmur (Systolic (pronounced in left upper sternal border))  heard.  Pulmonary:     Breath sounds: No wheezing or rales.  Abdominal:     Palpations: Abdomen is soft.     Tenderness: There is no abdominal tenderness.  Musculoskeletal:     Cervical back: Neck supple. No rigidity or tenderness.     Right lower leg: No edema.     Left lower leg: No edema.  Skin:    General: Skin  is warm.     Findings: No rash.  Neurological:     General: No focal deficit present.     Mental Status: She is alert and oriented to person, place, and time.     Cranial Nerves: No cranial nerve deficit.     Sensory: No sensory deficit.     Motor: No weakness.  Psychiatric:        Mood and Affect: Mood normal.        Behavior: Behavior normal.     BP 105/65 (BP Location: Right Arm, Patient Position: Sitting, Cuff Size: Normal)   Pulse 90   Ht 5' 1.5" (1.562 m)   Wt 127 lb 3.2 oz (57.7 kg)   SpO2 94%   BMI 23.65 kg/m  Wt Readings from Last 3 Encounters:  08/09/23 127 lb 3.2 oz (57.7 kg)  01/25/23 127 lb 9.6 oz (57.9 kg)  11/24/22 126 lb (57.2 kg)    Lab Results  Component Value Date   TSH 2.740 08/01/2023   Lab Results  Component Value Date   WBC 6.7 08/01/2023   HGB 16.0 (H) 08/01/2023   HCT 49.7 (H) 08/01/2023   MCV 98 (H) 08/01/2023   PLT 135 (L) 08/01/2023   Lab Results  Component Value Date   NA 140 08/01/2023   K 4.1 08/01/2023   CO2 22 08/01/2023   GLUCOSE 132 (H) 08/01/2023   BUN 20 08/01/2023   CREATININE 0.88 08/01/2023   BILITOT 0.6 08/01/2023   ALKPHOS 93 08/01/2023   AST 25 08/01/2023   ALT 22 08/01/2023   PROT 7.4 08/01/2023   ALBUMIN 4.8 08/01/2023   CALCIUM 9.7 08/01/2023   ANIONGAP 8 07/02/2020   EGFR 77 08/01/2023   GFR 72.89 05/17/2023   Lab Results  Component Value Date   CHOL 135 08/01/2023   Lab Results  Component Value Date   HDL 39 (L) 08/01/2023   Lab Results  Component Value Date   LDLCALC 67 08/01/2023   Lab Results  Component Value Date   TRIG 173 (H) 08/01/2023   Lab Results  Component Value  Date   CHOLHDL 3.5 08/01/2023   Lab Results  Component Value Date   HGBA1C 6.7 (H) 08/01/2023      Assessment & Plan:   Problem List Items Addressed This Visit       Cardiovascular and Mediastinum   Essential hypertension, benign    BP Readings from Last 1 Encounters:  08/09/23 105/65   Well-controlled with Metoprolol and Spironolactone Counseled for compliance with the medications Advised DASH diet and moderate exercise/walking, at least 150 mins/week      Relevant Medications   metoprolol succinate (TOPROL-XL) 25 MG 24 hr tablet   spironolactone (ALDACTONE) 25 MG tablet   Atypical atrial flutter (HCC) - Primary    S/p Cardioversion in 02/2020 On Metoprolol, Digoxin and Coumadin      Relevant Medications   metoprolol succinate (TOPROL-XL) 25 MG 24 hr tablet   spironolactone (ALDACTONE) 25 MG tablet   Chronic systolic CHF (congestive heart failure) (HCC)    Last echo (09/24) reviewed, showed moderate LV dysfunction -similar compared to prior Followed by cardiology-last visit note reviewed Appears euvolemic currently On Jardiance 25 mg QD, spironolactone 25 mg QD and Lasix 40 mg QD Did not tolerate ACEi/ARB      Relevant Medications   metoprolol succinate (TOPROL-XL) 25 MG 24 hr tablet   spironolactone (ALDACTONE) 25 MG tablet     Endocrine   Diabetes mellitus (HCC)  Lab Results  Component Value Date   HGBA1C 6.7 (H) 08/01/2023   Well-controlled Associated with HTN, CHF and HLD On Jardiance 25 mg QD Advised to follow diabetic diet On statin and Spironolactone F/u CMP and lipid panel Diabetic eye exam: Advised to follow up with Ophthalmology for diabetic eye exam        Hematopoietic and Hemostatic   Thrombocytopenia (HCC)    Platelets stable compared to prior        Other   Cardiac pacemaker in situ    Since 1979, last changed in 2014 Follows EP Cardiology at [facility] On Eliquis      H/O Mustard procedure    H/o transposition of great  aretries s/p Mustard surgery (1969) Follows up with Pediatric Cardiology and Cardiologist at [facility]      Mixed hyperlipidemia    On Crestor Checked lipid profile Advised to follow DASH diet      Relevant Medications   metoprolol succinate (TOPROL-XL) 25 MG 24 hr tablet   spironolactone (ALDACTONE) 25 MG tablet   Other Visit Diagnoses     Encounter for immunization       Relevant Orders   Flu vaccine trivalent PF, 6mos and older(Flulaval,Afluria,Fluarix,Fluzone) (Completed)       Meds ordered this encounter  Medications   metoprolol succinate (TOPROL-XL) 25 MG 24 hr tablet    Sig: Take 1 tablet (25 mg total) by mouth daily.    Dispense:  90 tablet    Refill:  1   spironolactone (ALDACTONE) 25 MG tablet    Sig: Take 1 tablet (25 mg total) by mouth daily.    Dispense:  30 tablet    Refill:  5     Follow-up: Return in about 6 months (around 02/06/2024) for DM.    Anabel Halon, MD

## 2023-08-09 NOTE — Assessment & Plan Note (Addendum)
Last echo (09/24) reviewed, showed moderate LV dysfunction -similar compared to prior Followed by cardiology-last visit note reviewed Appears euvolemic currently On Jardiance 25 mg QD, spironolactone 25 mg QD and Lasix 40 mg QD Did not tolerate ACEi/ARB

## 2023-08-09 NOTE — Assessment & Plan Note (Signed)
Platelets stable compared to prior

## 2023-08-09 NOTE — Assessment & Plan Note (Signed)
On Crestor Checked lipid profile Advised to follow DASH diet

## 2023-08-09 NOTE — Assessment & Plan Note (Signed)
BP Readings from Last 1 Encounters:  08/09/23 105/65   Well-controlled with Metoprolol and Spironolactone Counseled for compliance with the medications Advised DASH diet and moderate exercise/walking, at least 150 mins/week

## 2023-08-09 NOTE — Assessment & Plan Note (Signed)
S/p Cardioversion in 02/2020 On Metoprolol, Digoxin and Coumadin 

## 2023-08-09 NOTE — Assessment & Plan Note (Signed)
Lab Results  Component Value Date   HGBA1C 6.7 (H) 08/01/2023   Well-controlled Associated with HTN, CHF and HLD On Jardiance 25 mg QD Advised to follow diabetic diet On statin and Spironolactone F/u CMP and lipid panel Diabetic eye exam: Advised to follow up with Ophthalmology for diabetic eye exam

## 2023-08-09 NOTE — Assessment & Plan Note (Deleted)
 Physical exam as documented. Immunization and cancer screening needs are specifically addressed at this visit.

## 2023-08-15 DIAGNOSIS — I517 Cardiomegaly: Secondary | ICD-10-CM | POA: Diagnosis not present

## 2023-08-15 DIAGNOSIS — I445 Left posterior fascicular block: Secondary | ICD-10-CM | POA: Diagnosis not present

## 2023-08-15 DIAGNOSIS — Z950 Presence of cardiac pacemaker: Secondary | ICD-10-CM | POA: Diagnosis not present

## 2023-08-15 DIAGNOSIS — Z0189 Encounter for other specified special examinations: Secondary | ICD-10-CM | POA: Diagnosis not present

## 2023-08-15 DIAGNOSIS — R9431 Abnormal electrocardiogram [ECG] [EKG]: Secondary | ICD-10-CM | POA: Diagnosis not present

## 2023-10-10 DIAGNOSIS — H40023 Open angle with borderline findings, high risk, bilateral: Secondary | ICD-10-CM | POA: Diagnosis not present

## 2023-10-10 DIAGNOSIS — D492 Neoplasm of unspecified behavior of bone, soft tissue, and skin: Secondary | ICD-10-CM | POA: Diagnosis not present

## 2023-10-10 DIAGNOSIS — H16423 Pannus (corneal), bilateral: Secondary | ICD-10-CM | POA: Diagnosis not present

## 2023-10-10 LAB — HM DIABETES EYE EXAM

## 2023-10-17 DIAGNOSIS — H5213 Myopia, bilateral: Secondary | ICD-10-CM | POA: Diagnosis not present

## 2023-10-17 DIAGNOSIS — H0102B Squamous blepharitis left eye, upper and lower eyelids: Secondary | ICD-10-CM | POA: Diagnosis not present

## 2023-10-17 DIAGNOSIS — H0102A Squamous blepharitis right eye, upper and lower eyelids: Secondary | ICD-10-CM | POA: Diagnosis not present

## 2023-10-17 DIAGNOSIS — H16423 Pannus (corneal), bilateral: Secondary | ICD-10-CM | POA: Diagnosis not present

## 2023-10-17 DIAGNOSIS — D492 Neoplasm of unspecified behavior of bone, soft tissue, and skin: Secondary | ICD-10-CM | POA: Diagnosis not present

## 2023-10-24 ENCOUNTER — Encounter: Payer: Self-pay | Admitting: Internal Medicine

## 2023-10-24 ENCOUNTER — Other Ambulatory Visit: Payer: Self-pay

## 2023-10-24 DIAGNOSIS — R600 Localized edema: Secondary | ICD-10-CM

## 2023-10-24 MED ORDER — FUROSEMIDE 40 MG PO TABS
40.0000 mg | ORAL_TABLET | Freq: Every day | ORAL | 1 refills | Status: DC
Start: 2023-10-24 — End: 2024-07-18

## 2023-11-09 DIAGNOSIS — I370 Nonrheumatic pulmonary valve stenosis: Secondary | ICD-10-CM | POA: Diagnosis not present

## 2023-11-09 DIAGNOSIS — I480 Paroxysmal atrial fibrillation: Secondary | ICD-10-CM | POA: Diagnosis not present

## 2023-11-09 DIAGNOSIS — Q208 Other congenital malformations of cardiac chambers and connections: Secondary | ICD-10-CM | POA: Diagnosis not present

## 2023-11-09 DIAGNOSIS — I484 Atypical atrial flutter: Secondary | ICD-10-CM | POA: Diagnosis not present

## 2023-11-09 DIAGNOSIS — Q203 Discordant ventriculoarterial connection: Secondary | ICD-10-CM | POA: Diagnosis not present

## 2023-11-09 DIAGNOSIS — I5022 Chronic systolic (congestive) heart failure: Secondary | ICD-10-CM | POA: Diagnosis not present

## 2023-11-23 DIAGNOSIS — N182 Chronic kidney disease, stage 2 (mild): Secondary | ICD-10-CM | POA: Diagnosis not present

## 2023-11-23 DIAGNOSIS — Z7984 Long term (current) use of oral hypoglycemic drugs: Secondary | ICD-10-CM | POA: Diagnosis not present

## 2023-11-23 DIAGNOSIS — I4892 Unspecified atrial flutter: Secondary | ICD-10-CM | POA: Diagnosis not present

## 2023-11-23 DIAGNOSIS — K7460 Unspecified cirrhosis of liver: Secondary | ICD-10-CM | POA: Diagnosis not present

## 2023-11-23 DIAGNOSIS — E1122 Type 2 diabetes mellitus with diabetic chronic kidney disease: Secondary | ICD-10-CM | POA: Diagnosis not present

## 2023-11-23 DIAGNOSIS — I4891 Unspecified atrial fibrillation: Secondary | ICD-10-CM | POA: Diagnosis not present

## 2023-11-23 DIAGNOSIS — I130 Hypertensive heart and chronic kidney disease with heart failure and stage 1 through stage 4 chronic kidney disease, or unspecified chronic kidney disease: Secondary | ICD-10-CM | POA: Diagnosis not present

## 2023-11-23 DIAGNOSIS — E785 Hyperlipidemia, unspecified: Secondary | ICD-10-CM | POA: Diagnosis not present

## 2023-11-23 DIAGNOSIS — I509 Heart failure, unspecified: Secondary | ICD-10-CM | POA: Diagnosis not present

## 2023-11-23 DIAGNOSIS — Z950 Presence of cardiac pacemaker: Secondary | ICD-10-CM | POA: Diagnosis not present

## 2023-11-23 DIAGNOSIS — D6869 Other thrombophilia: Secondary | ICD-10-CM | POA: Diagnosis not present

## 2023-11-23 DIAGNOSIS — Z823 Family history of stroke: Secondary | ICD-10-CM | POA: Diagnosis not present

## 2023-11-28 DIAGNOSIS — H40023 Open angle with borderline findings, high risk, bilateral: Secondary | ICD-10-CM | POA: Diagnosis not present

## 2023-12-01 ENCOUNTER — Encounter: Payer: Self-pay | Admitting: Internal Medicine

## 2023-12-21 DIAGNOSIS — Z950 Presence of cardiac pacemaker: Secondary | ICD-10-CM | POA: Diagnosis not present

## 2023-12-21 DIAGNOSIS — I517 Cardiomegaly: Secondary | ICD-10-CM | POA: Diagnosis not present

## 2024-01-12 DIAGNOSIS — Q203 Discordant ventriculoarterial connection: Secondary | ICD-10-CM | POA: Diagnosis not present

## 2024-01-14 ENCOUNTER — Other Ambulatory Visit: Payer: Self-pay | Admitting: Internal Medicine

## 2024-01-30 DIAGNOSIS — H40023 Open angle with borderline findings, high risk, bilateral: Secondary | ICD-10-CM | POA: Diagnosis not present

## 2024-02-06 ENCOUNTER — Encounter: Payer: Self-pay | Admitting: Internal Medicine

## 2024-02-06 ENCOUNTER — Ambulatory Visit: Payer: 59 | Admitting: Internal Medicine

## 2024-02-06 VITALS — BP 120/76 | HR 87 | Ht 61.5 in | Wt 124.4 lb

## 2024-02-06 DIAGNOSIS — I484 Atypical atrial flutter: Secondary | ICD-10-CM

## 2024-02-06 DIAGNOSIS — I10 Essential (primary) hypertension: Secondary | ICD-10-CM | POA: Diagnosis not present

## 2024-02-06 DIAGNOSIS — Z7984 Long term (current) use of oral hypoglycemic drugs: Secondary | ICD-10-CM | POA: Diagnosis not present

## 2024-02-06 DIAGNOSIS — D696 Thrombocytopenia, unspecified: Secondary | ICD-10-CM | POA: Diagnosis not present

## 2024-02-06 DIAGNOSIS — Z950 Presence of cardiac pacemaker: Secondary | ICD-10-CM | POA: Diagnosis not present

## 2024-02-06 DIAGNOSIS — Z23 Encounter for immunization: Secondary | ICD-10-CM | POA: Diagnosis not present

## 2024-02-06 DIAGNOSIS — E782 Mixed hyperlipidemia: Secondary | ICD-10-CM | POA: Diagnosis not present

## 2024-02-06 DIAGNOSIS — I5022 Chronic systolic (congestive) heart failure: Secondary | ICD-10-CM | POA: Diagnosis not present

## 2024-02-06 DIAGNOSIS — E1169 Type 2 diabetes mellitus with other specified complication: Secondary | ICD-10-CM | POA: Diagnosis not present

## 2024-02-06 DIAGNOSIS — Z8774 Personal history of (corrected) congenital malformations of heart and circulatory system: Secondary | ICD-10-CM

## 2024-02-06 MED ORDER — BLOOD GLUCOSE TEST VI STRP
1.0000 | ORAL_STRIP | Freq: Three times a day (TID) | 0 refills | Status: AC
Start: 2024-02-06 — End: ?

## 2024-02-06 MED ORDER — LANCETS MISC. MISC
1.0000 | Freq: Three times a day (TID) | 0 refills | Status: AC
Start: 2024-02-06 — End: ?

## 2024-02-06 MED ORDER — BLOOD GLUCOSE MONITORING SUPPL DEVI
1.0000 | Freq: Three times a day (TID) | 0 refills | Status: AC
Start: 2024-02-06 — End: ?

## 2024-02-06 NOTE — Assessment & Plan Note (Signed)
 Last echo (09/24) reviewed, showed moderate LV dysfunction -similar compared to prior Followed by cardiology-last visit note reviewed Appears euvolemic currently On Jardiance 25 mg QD, spironolactone 25 mg QD and Lasix 40 mg QD Did not tolerate ACEi/ARB

## 2024-02-06 NOTE — Patient Instructions (Signed)
 Please continue to take medications as prescribed.  Please continue to follow low carb diet and perform moderate exercise/walking at least 150 mins/week.

## 2024-02-06 NOTE — Assessment & Plan Note (Signed)
 BP Readings from Last 1 Encounters:  02/06/24 120/76   Well-controlled with Metoprolol  and Spironolactone  Counseled for compliance with the medications Advised DASH diet and moderate exercise/walking, at least 150 mins/week

## 2024-02-06 NOTE — Assessment & Plan Note (Addendum)
 H/o transposition of great aretries s/p Mustard surgery (1969) Follows up with Pediatric Cardiology and Cardiologist at [facility]

## 2024-02-06 NOTE — Assessment & Plan Note (Signed)
S/p Cardioversion in 02/2020 On Metoprolol, Digoxin and Coumadin 

## 2024-02-06 NOTE — Progress Notes (Signed)
 Established Patient Office Visit  Subjective:  Patient ID: Brandi Fischer, female    DOB: 1966/04/30  Age: 58 y.o. MRN: [id number]  CC:  Chief Complaint  Patient presents with   Medical Management of Chronic Issues    6 month f/u .     HPI Brandi Fischer is a 58 y.o. 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  DM: Her HbA1c was 6.7 in 10/24.  She is taking Jardiance 25 mg QD.  Denies polyuria or polyphagia currently.  CHF: She has been seeing Dr Pati Bonine for her congenital heart defect.  She takes Jardiance 25 mg QD, spironolactone  25 mg QD and Lasix  40 mg QOD.  Denies any headache, dizziness, chest pain, dyspnea or palpitations.  She is planned to get AICD placement.  Atrial flutter: Followed by Dr. Arvell Birchwood.  She is on Toprol , digoxin  and Eliquis currently.  Denies any easy bruising or active bleeding.  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 grade   Unspecified chronic conjunctivitis, left eye     Past Surgical History:  Procedure Laterality Date   ABDOMINAL HYSTERECTOMY     APPENDECTOMY  10/2010   cardiac pacemaker procedure     CARDIAC SURGERY     TUBAL LIGATION      Family History  Problem Relation Age of Onset   Stroke Father    Diabetes Maternal Grandfather    Diabetes Paternal Grandmother    Breast cancer Paternal Grandmother    Breast cancer Paternal Aunt    Colon cancer Neg Hx    Esophageal cancer Neg Hx    Rectal cancer Neg Hx    Stomach cancer Neg Hx     Social  History   Socioeconomic History   Marital status: Married    Spouse name: Not on file   Number of children: Not on file   Years of education: Not on file   Highest education level: Not on file  Occupational History   Not on file  Tobacco Use   Smoking status: Never   Smokeless tobacco: Never  Vaping Use   Vaping status: Never Used  Substance and Sexual Activity   Alcohol use: No   Drug use: No   Sexual activity: Not on file  Other Topics Concern   Not on file  Social History Narrative   Not on file   Social Drivers of Health   Financial Resource Strain: Not on file  Food Insecurity: Not on file  Transportation Needs: Not on file  Physical Activity: Not on file  Stress: Not on file  Social Connections: Not on file  Intimate Partner Violence: Not on file    Outpatient Medications Prior to Visit  Medication Sig Dispense Refill   latanoprost (XALATAN) 0.005 % ophthalmic solution Place 1 drop into both eyes at bedtime.     calcium -vitamin D  (OSCAL WITH D) 500-200 MG-UNIT per tablet Take 1 tablet by mouth.     digoxin  (LANOXIN ) 0.125 MG  tablet TAKE 1 TABLET BY MOUTH EVERY DAY 30 tablet 2   ELIQUIS 5 MG TABS tablet TAKE ONE TABLET BY MOUTH EVERY 12 HOURS 180 tablet 3   furosemide  (LASIX ) 40 MG tablet Take 1 tablet (40 mg total) by mouth daily. 90 tablet 1   JARDIANCE 25 MG TABS tablet TAKE ONE TABLET BY MOUTH EVERY DAY 30 tablet 3   levalbuterol  (XOPENEX  HFA) 45 MCG/ACT inhaler Inhale 1 puff into the lungs every 6 (six) hours as needed for wheezing. 1 each 12   Magnesium (V-R MAGNESIUM) 250 MG TABS Take 250 mg by mouth daily.      metoprolol  succinate (TOPROL -XL) 25 MG 24 hr tablet Take 1 tablet (25 mg total) by mouth daily. 90 tablet 1   Multiple Vitamin (MULTIVITAMIN PO) Take 1 tablet by mouth daily.     rosuvastatin  (CRESTOR ) 5 MG tablet TAKE 1 TABLET BY MOUTH EVERY DAY 30 tablet 5   spironolactone  (ALDACTONE ) 25 MG tablet Take 1 tablet (25 mg total) by mouth daily. 30  tablet 5   triamcinolone  (KENALOG ) 0.025 % ointment Apply 1 Application topically 2 (two) times daily. 30 g 0   No facility-administered medications prior to visit.    Allergies  Allergen Reactions   Shellfish Allergy     Hives    ROS Review of Systems  Constitutional:  Negative for chills and fever.  HENT:  Negative for congestion, sinus pressure, sinus pain and sore throat.   Eyes:  Negative for pain and discharge.  Respiratory:  Negative for cough and shortness of breath.   Cardiovascular:  Negative for chest pain, palpitations and leg swelling.  Gastrointestinal:  Negative for abdominal pain, diarrhea, nausea and vomiting.  Endocrine: Negative for polydipsia and polyuria.  Genitourinary:  Negative for dysuria and hematuria.  Musculoskeletal:  Negative for neck pain and neck stiffness.  Skin:  Negative for rash.  Neurological:  Negative for dizziness, syncope, weakness and numbness.  Psychiatric/Behavioral:  Negative for agitation and behavioral problems.       Objective:    Physical Exam Vitals reviewed.  Constitutional:      General: She is not in acute distress.    Appearance: She is not diaphoretic.  HENT:     Head: Normocephalic and atraumatic.     Nose: Nose normal. No congestion.     Mouth/Throat:     Mouth: Mucous membranes are moist.     Pharynx: No posterior oropharyngeal erythema.  Eyes:     General: No scleral icterus.    Extraocular Movements: Extraocular movements intact.  Cardiovascular:     Rate and Rhythm: Normal rate and regular rhythm.     Pulses: Normal pulses.     Heart sounds: Murmur (Systolic (pronounced in left upper sternal border)) heard.  Pulmonary:     Breath sounds: No wheezing or rales.  Musculoskeletal:     Cervical back: Neck supple. No rigidity or tenderness.     Right lower leg: No edema.     Left lower leg: No edema.  Skin:    General: Skin is warm.     Findings: No rash.  Neurological:     General: No focal deficit  present.     Mental Status: She is alert and oriented to person, place, and time.     Sensory: No sensory deficit.     Motor: No weakness.  Psychiatric:        Mood and Affect: Mood normal.        Behavior: Behavior  normal.     BP 120/76   Pulse 87   Ht 5' 1.5" (1.562 m)   Wt 124 lb 6.4 oz (56.4 kg)   SpO2 94%   BMI 23.12 kg/m  Wt Readings from Last 3 Encounters:  02/06/24 124 lb 6.4 oz (56.4 kg)  08/09/23 127 lb 3.2 oz (57.7 kg)  01/25/23 127 lb 9.6 oz (57.9 kg)    Lab Results  Component Value Date   TSH 2.740 08/01/2023   Lab Results  Component Value Date   WBC 6.7 08/01/2023   HGB 16.0 (H) 08/01/2023   HCT 49.7 (H) 08/01/2023   MCV 98 (H) 08/01/2023   PLT 135 (L) 08/01/2023   Lab Results  Component Value Date   NA 140 08/01/2023   K 4.1 08/01/2023   CO2 22 08/01/2023   GLUCOSE 132 (H) 08/01/2023   BUN 20 08/01/2023   CREATININE 0.88 08/01/2023   BILITOT 0.6 08/01/2023   ALKPHOS 93 08/01/2023   AST 25 08/01/2023   ALT 22 08/01/2023   PROT 7.4 08/01/2023   ALBUMIN 4.8 08/01/2023   CALCIUM  9.7 08/01/2023   ANIONGAP 8 07/02/2020   EGFR 77 08/01/2023   GFR 72.89 05/17/2023   Lab Results  Component Value Date   CHOL 135 08/01/2023   Lab Results  Component Value Date   HDL 39 (L) 08/01/2023   Lab Results  Component Value Date   LDLCALC 67 08/01/2023   Lab Results  Component Value Date   TRIG 173 (H) 08/01/2023   Lab Results  Component Value Date   CHOLHDL 3.5 08/01/2023   Lab Results  Component Value Date   HGBA1C 6.7 (H) 08/01/2023      Assessment & Plan:   Problem List Items Addressed This Visit       Cardiovascular and Mediastinum   Essential hypertension, benign   BP Readings from Last 1 Encounters:  02/06/24 120/76   Well-controlled with Metoprolol  and Spironolactone  Counseled for compliance with the medications Advised DASH diet and moderate exercise/walking, at least 150 mins/week      Atypical atrial flutter (HCC)    S/p Cardioversion in 02/2020 On Metoprolol , Digoxin  and Coumadin       Chronic systolic CHF (congestive heart failure) (HCC)   Last echo (09/24) reviewed, showed moderate LV dysfunction -similar compared to prior Followed by cardiology-last visit note reviewed Appears euvolemic currently On Jardiance 25 mg QD, spironolactone  25 mg QD and Lasix  40 mg QD Did not tolerate ACEi/ARB        Endocrine   Diabetes mellitus (HCC) - Primary   Lab Results  Component Value Date   HGBA1C 6.7 (H) 08/01/2023   Well-controlled Associated with HTN, CHF and HLD On Jardiance 25 mg once daily Prescribed glucometer and supplies for intermittent checking of blood glucose Advised to follow diabetic diet On statin and Spironolactone  F/u CMP and lipid panel Diabetic eye exam: Advised to follow up with Ophthalmology for diabetic eye exam      Relevant Medications   Blood Glucose Monitoring Suppl DEVI   Glucose Blood (BLOOD GLUCOSE TEST STRIPS) STRP   Lancets Misc. MISC   Other Relevant Orders   Microalbumin / creatinine urine ratio   CMP14+EGFR   Hemoglobin A1c     Hematopoietic and Hemostatic   Thrombocytopenia (HCC)   Platelets stable compared to prior      Relevant Orders   CBC with Differential/Platelet     Other   Cardiac pacemaker in situ   Since 1979, last  changed in 2014 Follows EP Cardiology On Eliquis      H/O Mustard procedure   H/o transposition of great aretries s/p Mustard surgery (1969) Follows up with Pediatric Cardiology and Cardiologist at [facility]      Mixed hyperlipidemia   On Crestor  Checked lipid profile Advised to follow DASH diet      Other Visit Diagnoses       Encounter for immunization       Relevant Orders   Pneumococcal conjugate vaccine 20-valent (Completed)       Meds ordered this encounter  Medications   Blood Glucose Monitoring Suppl DEVI    Sig: 1 each by Does not apply route in the morning, at noon, and at bedtime. May substitute  to any manufacturer covered by patient's insurance.    Dispense:  1 each    Refill:  0   Glucose Blood (BLOOD GLUCOSE TEST STRIPS) STRP    Sig: 1 each by In Vitro route in the morning, at noon, and at bedtime. May substitute to any manufacturer covered by patient's insurance.    Dispense:  100 strip    Refill:  0   Lancets Misc. MISC    Sig: 1 each by Does not apply route in the morning, at noon, and at bedtime. May substitute to any manufacturer covered by patient's insurance.    Dispense:  100 each    Refill:  0     Follow-up: Return in about 6 months (around 08/08/2024) for Annual physical.    Meldon Sport, MD

## 2024-02-06 NOTE — Assessment & Plan Note (Addendum)
 Lab Results  Component Value Date   HGBA1C 6.7 (H) 08/01/2023   Well-controlled Associated with HTN, CHF and HLD On Jardiance 25 mg once daily Prescribed glucometer and supplies for intermittent checking of blood glucose Advised to follow diabetic diet On statin and Spironolactone  F/u CMP and lipid panel Diabetic eye exam: Advised to follow up with Ophthalmology for diabetic eye exam

## 2024-02-06 NOTE — Assessment & Plan Note (Signed)
 On Crestor Checked lipid profile Advised to follow DASH diet

## 2024-02-06 NOTE — Assessment & Plan Note (Signed)
Platelets stable compared to prior

## 2024-02-06 NOTE — Assessment & Plan Note (Addendum)
 Since 1979, last changed in 2014 Follows EP Cardiology On Eliquis

## 2024-02-07 LAB — CBC WITH DIFFERENTIAL/PLATELET
Basophils Absolute: 0.1 10*3/uL (ref 0.0–0.2)
Basos: 1 %
EOS (ABSOLUTE): 0.2 10*3/uL (ref 0.0–0.4)
Eos: 3 %
Hematocrit: 50.6 % — ABNORMAL HIGH (ref 34.0–46.6)
Hemoglobin: 17.1 g/dL — ABNORMAL HIGH (ref 11.1–15.9)
Immature Grans (Abs): 0 10*3/uL (ref 0.0–0.1)
Immature Granulocytes: 0 %
Lymphocytes Absolute: 1.4 10*3/uL (ref 0.7–3.1)
Lymphs: 21 %
MCH: 31.7 pg (ref 26.6–33.0)
MCHC: 33.8 g/dL (ref 31.5–35.7)
MCV: 94 fL (ref 79–97)
Monocytes Absolute: 0.5 10*3/uL (ref 0.1–0.9)
Monocytes: 7 %
Neutrophils Absolute: 4.6 10*3/uL (ref 1.4–7.0)
Neutrophils: 68 %
Platelets: 146 10*3/uL — ABNORMAL LOW (ref 150–450)
RBC: 5.39 x10E6/uL — ABNORMAL HIGH (ref 3.77–5.28)
RDW: 13.1 % (ref 11.7–15.4)
WBC: 6.9 10*3/uL (ref 3.4–10.8)

## 2024-02-07 LAB — CMP14+EGFR
ALT: 24 IU/L (ref 0–32)
AST: 28 IU/L (ref 0–40)
Albumin: 5.3 g/dL — ABNORMAL HIGH (ref 3.8–4.9)
Alkaline Phosphatase: 97 IU/L (ref 44–121)
BUN/Creatinine Ratio: 15 (ref 9–23)
BUN: 13 mg/dL (ref 6–24)
Bilirubin Total: 0.9 mg/dL (ref 0.0–1.2)
CO2: 22 mmol/L (ref 20–29)
Calcium: 9.9 mg/dL (ref 8.7–10.2)
Chloride: 95 mmol/L — ABNORMAL LOW (ref 96–106)
Creatinine, Ser: 0.85 mg/dL (ref 0.57–1.00)
Globulin, Total: 2.8 g/dL (ref 1.5–4.5)
Glucose: 124 mg/dL — ABNORMAL HIGH (ref 70–99)
Potassium: 4.3 mmol/L (ref 3.5–5.2)
Sodium: 138 mmol/L (ref 134–144)
Total Protein: 8.1 g/dL (ref 6.0–8.5)
eGFR: 79 mL/min/{1.73_m2} (ref >59)

## 2024-02-07 LAB — HEMOGLOBIN A1C
Est. average glucose Bld gHb Est-mCnc: 151 mg/dL
Hgb A1c MFr Bld: 6.9 % — ABNORMAL HIGH (ref 4.8–5.6)

## 2024-02-07 NOTE — Telephone Encounter (Unsigned)
 Copied from CRM [id number]. Topic: Clinical - Lab/Test Results >> Feb 07, 2024  9:14 AM Everlene Hobby D wrote: Patient would like a call back to go over her results she has some questions

## 2024-02-08 ENCOUNTER — Other Ambulatory Visit (INDEPENDENT_AMBULATORY_CARE_PROVIDER_SITE_OTHER): Admitting: Internal Medicine

## 2024-02-08 DIAGNOSIS — D751 Secondary polycythemia: Secondary | ICD-10-CM | POA: Insufficient documentation

## 2024-02-08 DIAGNOSIS — D696 Thrombocytopenia, unspecified: Secondary | ICD-10-CM

## 2024-02-08 LAB — MICROALBUMIN / CREATININE URINE RATIO
Creatinine, Urine: 8.1 mg/dL
Microalbumin, Urine: <3 ug/mL

## 2024-02-08 NOTE — Progress Notes (Signed)
 Orders only encounter

## 2024-02-09 ENCOUNTER — Telehealth: Payer: Self-pay | Admitting: Pharmacy Technician

## 2024-02-09 ENCOUNTER — Other Ambulatory Visit (HOSPITAL_COMMUNITY): Payer: Self-pay

## 2024-02-09 DIAGNOSIS — I370 Nonrheumatic pulmonary valve stenosis: Secondary | ICD-10-CM | POA: Diagnosis not present

## 2024-02-09 DIAGNOSIS — Q208 Other congenital malformations of cardiac chambers and connections: Secondary | ICD-10-CM | POA: Diagnosis not present

## 2024-02-09 DIAGNOSIS — I5022 Chronic systolic (congestive) heart failure: Secondary | ICD-10-CM | POA: Diagnosis not present

## 2024-02-09 DIAGNOSIS — I480 Paroxysmal atrial fibrillation: Secondary | ICD-10-CM | POA: Diagnosis not present

## 2024-02-09 DIAGNOSIS — Q203 Discordant ventriculoarterial connection: Secondary | ICD-10-CM | POA: Diagnosis not present

## 2024-02-09 DIAGNOSIS — I484 Atypical atrial flutter: Secondary | ICD-10-CM | POA: Diagnosis not present

## 2024-02-09 NOTE — Telephone Encounter (Signed)
 Pharmacy Patient Advocate Encounter  Received notification from [facility] that Prior Authorization for Jardiance 25MG  tablets has been APPROVED from 02/09/2024 to 02/08/2025. Ran test claim, Copay is $40.00. This test claim was processed through [facility]- copay amounts may vary at other pharmacies due to pharmacy/plan contracts, or as the patient moves through the different stages of their insurance plan.   PA #/Case ID/Reference #: [id number]

## 2024-02-09 NOTE — Telephone Encounter (Signed)
 Pharmacy Patient Advocate Encounter   Received notification from CoverMyMeds that prior authorization for Jardiance 25MG  tablets is required/requested.   Insurance verification completed.   The patient is insured through [facility] .   Per test claim: PA required; PA submitted to above mentioned insurance via CoverMyMeds Key/confirmation #/EOC [facility] Status is pending

## 2024-02-23 ENCOUNTER — Other Ambulatory Visit: Payer: Self-pay | Admitting: Internal Medicine

## 2024-02-23 DIAGNOSIS — E782 Mixed hyperlipidemia: Secondary | ICD-10-CM

## 2024-03-08 DIAGNOSIS — H40023 Open angle with borderline findings, high risk, bilateral: Secondary | ICD-10-CM | POA: Diagnosis not present

## 2024-03-19 DIAGNOSIS — Z950 Presence of cardiac pacemaker: Secondary | ICD-10-CM | POA: Diagnosis not present

## 2024-03-19 DIAGNOSIS — I499 Cardiac arrhythmia, unspecified: Secondary | ICD-10-CM | POA: Diagnosis not present

## 2024-03-25 ENCOUNTER — Ambulatory Visit
Admission: EM | Admit: 2024-03-25 | Discharge: 2024-03-25 | Disposition: A | Discharge status: Home / Self Care | Attending: Internal Medicine | Admitting: Internal Medicine

## 2024-03-25 DIAGNOSIS — J069 Acute upper respiratory infection, unspecified: Secondary | ICD-10-CM

## 2024-03-25 LAB — POC SARS CORONAVIRUS 2 AG -  ED: SARS Coronavirus 2 Ag: NEGATIVE

## 2024-03-25 NOTE — Discharge Instructions (Signed)

## 2024-03-25 NOTE — ED Provider Notes (Addendum)
 [facility]    CSN: [id number] Arrival date & time: 03/25/24  1036      History   Chief Complaint No chief complaint on file.   HPI Brandi Fischer is a 58 y.o. female.   Patient presents to urgent care for evaluation of cough, nasal congestion, sore throat, and generalized fatigue that started yesterday.  No recent sick contacts with similar symptoms.  Cough is productive with phlegm.  She denies recent fever, chills, nausea, vomiting, diarrhea, abdominal pain, rash, leg swelling, orthopnea, or changes in weight in the last few days since becoming sick.  History of congestive heart failure, has pacemaker.  Denies chest pain and shortness of breath.  Never smoker, denies history of chronic respiratory problems like asthma or COPD.  She is taking over-the-counter medications including cough drops with some relief.  Tylenol  helped a little bit with sore throat.     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 grade   Unspecified chronic conjunctivitis, left eye     Patient Active Problem List   Diagnosis Date Noted   Erythrocytosis 02/08/2024   Irritant contact dermatitis due to cosmetics 11/16/2022   Wheezing 09/22/2022   GAD (generalized anxiety disorder) 08/03/2022   Osteopenia 03/24/2022   Diabetes mellitus (HCC) 07/23/2021   Encounter for general adult medical examination with abnormal findings 07/23/2021   Chronic systolic CHF (congestive heart failure) (HCC) 06/13/2021   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   Paroxysmal atrial fibrillation (HCC) 06/08/2021   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   Mixed hyperlipidemia 01/21/2021   Pulmonary regurgitation 10/22/2020   Neck muscle strain  07/22/2020   H/O Mustard procedure 07/04/2020   Edema of both legs 02/20/2020   Essential hypertension, benign 02/20/2020   Thrombocytopenia (HCC) 11/28/2019   Atypical atrial flutter (HCC) 05/01/2018   Cardiac pacemaker in situ 01/15/2013   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      Home Medications    Prior to Admission medications   Medication Sig Start Date End Date Taking? Authorizing Provider  Blood Glucose Monitoring Suppl DEVI 1 each by Does not apply route in the morning, at noon, and at bedtime. May substitute to any manufacturer covered by patient's insurance. 02/06/24   Tobie Suzzane POUR, MD  calcium -vitamin D  (OSCAL WITH D) 500-200 MG-UNIT per tablet Take 1 tablet by mouth.    [provider]  digoxin  (LANOXIN ) 0.125 MG tablet TAKE 1 TABLET BY MOUTH EVERY DAY 04/13/23   Patel, Rutwik K, MD  ELIQUIS 5 MG TABS tablet TAKE ONE TABLET BY MOUTH EVERY 12 HOURS 01/16/24   Tobie Suzzane POUR, MD  furosemide  (LASIX ) 40 MG tablet Take 1 tablet (40 mg total) by mouth daily. 10/24/23   Tobie Suzzane POUR, MD  Glucose Blood (BLOOD GLUCOSE TEST STRIPS) STRP 1 each by In Vitro route in the morning, at noon, and at bedtime.  May substitute to any manufacturer covered by patient's insurance. 02/06/24   Tobie Suzzane POUR, MD  JARDIANCE 25 MG TABS tablet TAKE ONE TABLET BY MOUTH EVERY DAY 01/16/24   Tobie Suzzane POUR, MD  Lancets Misc. MISC 1 each by Does not apply route in the morning, at noon, and at bedtime. May substitute to any manufacturer covered by patient's insurance. 02/06/24   Tobie Suzzane POUR, MD  latanoprost (XALATAN) 0.005 % ophthalmic solution Place 1 drop into both eyes at bedtime. 01/30/24   [provider]  levalbuterol  (XOPENEX  HFA) 45 MCG/ACT inhaler  Inhale 1 puff into the lungs every 6 (six) hours as needed for wheezing. 09/22/22   Tobie Suzzane POUR, MD  Magnesium (V-R MAGNESIUM) 250 MG TABS Take 250 mg by mouth daily.     [provider]  metoprolol  succinate (TOPROL -XL) 25 MG 24 hr tablet Take 1 tablet (25 mg total) by mouth daily. 08/09/23   Patel, Rutwik K, MD  Multiple Vitamin (MULTIVITAMIN PO) Take 1 tablet by mouth daily.    [provider]  rosuvastatin  (CRESTOR ) 5 MG tablet TAKE ONE TABLET BY MOUTH EVERY DAY 02/23/24   Tobie Suzzane POUR, MD  spironolactone  (ALDACTONE ) 25 MG tablet Take 1 tablet (25 mg total) by mouth daily. 08/09/23   Tobie Suzzane POUR, MD  triamcinolone  (KENALOG ) 0.025 % ointment Apply 1 Application topically 2 (two) times daily. 11/16/22   Golda Lynwood PARAS, MD    Family History Family History  Problem Relation Age of Onset   Stroke Father    Diabetes Maternal Grandfather    Diabetes Paternal Grandmother    Breast cancer Paternal Grandmother    Breast cancer Paternal Aunt    Colon cancer Neg Hx    Esophageal cancer Neg Hx    Rectal cancer Neg Hx    Stomach cancer Neg Hx     Social History Social History   Tobacco Use   Smoking status: Never   Smokeless tobacco: Never  Vaping Use   Vaping status: Never Used  Substance Use Topics   Alcohol use: No   Drug use: No     Allergies   Shellfish allergy   Review of Systems Review of Systems Per HPI  Physical Exam Triage Vital Signs ED Triage Vitals  Encounter Vitals Group     BP 03/25/24 1052 120/80     Girls Systolic BP Percentile --      Girls Diastolic BP Percentile --      Boys Systolic BP Percentile --      Boys Diastolic BP Percentile --      Pulse Rate 03/25/24 1052 79     Resp 03/25/24 1050 18     Temp 03/25/24 1052 98.2 F (36.8 C)     Temp Source 03/25/24 1050 Oral     SpO2 03/25/24 1052 94 %     Weight --      Height --      Head Circumference --      Peak Flow --      Pain Score 03/25/24 1047 0     Pain Loc --       Pain Education --      Exclude from Growth Chart --    No data found.  Updated Vital Signs BP 120/80 (BP Location: Right Arm)   Pulse 79   Temp 98.2 F (36.8 C) (Oral)   Resp 18   SpO2 94%   Visual Acuity Right Eye Distance:   Left Eye  Distance:   Bilateral Distance:    Right Eye Near:   Left Eye Near:    Bilateral Near:     Physical Exam Vitals and nursing note reviewed.  Constitutional:      Appearance: She is not ill-appearing or toxic-appearing.  HENT:     Head: Normocephalic and atraumatic.     Right Ear: Hearing, tympanic membrane, ear canal and external ear normal.     Left Ear: Hearing, tympanic membrane, ear canal and external ear normal.     Nose: Congestion present.     Mouth/Throat:     Lips: Pink.     Mouth: Mucous membranes are moist. No injury or oral lesions.     Dentition: Normal dentition.     Tongue: No lesions.     Pharynx: Oropharynx is clear. Uvula midline. Posterior oropharyngeal erythema present. No pharyngeal swelling, oropharyngeal exudate, uvula swelling or postnasal drip.     Tonsils: No tonsillar exudate.     Comments: Mild erythema to posterior oropharynx with small amount of clear postnasal drainage visualized. No trismus, phonation normal, maintaining secretions without difficulty.   Eyes:     General: Lids are normal. Vision grossly intact. Gaze aligned appropriately.     Extraocular Movements: Extraocular movements intact.     Conjunctiva/sclera: Conjunctivae normal.   Neck:     Trachea: Trachea and phonation normal.   Cardiovascular:     Rate and Rhythm: Normal rate and regular rhythm.     Heart sounds: S1 normal and S2 normal. Murmur (Baseline) heard.  Pulmonary:     Effort: Pulmonary effort is normal. No respiratory distress.     Breath sounds: Normal breath sounds and air entry. No wheezing, rhonchi or rales.     Comments: Speaking in full sentences without difficulty. Chest:     Chest wall: No tenderness.    Musculoskeletal:     Cervical back: Neck supple.  Lymphadenopathy:     Cervical: No cervical adenopathy.   Skin:    General: Skin is warm and dry.     Capillary Refill: Capillary refill takes less than 2 seconds.     Findings: No rash.   Neurological:     General: No focal deficit present.     Mental Status: She is alert and oriented to person, place, and time. Mental status is at baseline.     Cranial Nerves: No dysarthria or facial asymmetry.   Psychiatric:        Mood and Affect: Mood normal.        Speech: Speech normal.        Behavior: Behavior normal.        Thought Content: Thought content normal.        Judgment: Judgment normal.      UC Treatments / Results  Labs (all labs ordered are listed, but only abnormal results are displayed) Labs Reviewed  POC SARS CORONAVIRUS 2 AG -  ED    EKG   Radiology No results found.  Procedures Procedures (including critical care time)  Medications Ordered in UC Medications - No data to display  Initial Impression / Assessment and Plan / UC Course  I have reviewed the triage vital signs and the nursing notes.  Pertinent labs & imaging results that were available during my care of the patient were reviewed by me and considered in my medical decision making (see chart for details).   1.  Viral URI with cough Suspect viral URI, viral syndrome.  Strep/viral testing: Point-of-care  COVID is negative.  Physical exam findings reassuring, vital signs hemodynamically stable, and lungs clear, therefore deferred imaging of the chest.  Advised supportive care/prescriptions for symptomatic relief as outlined in AVS.   Counseled patient on potential for adverse effects with medications prescribed/recommended today, strict ER and return-to-clinic precautions discussed, patient verbalized understanding.    Final Clinical Impressions(s) / UC Diagnoses   Final diagnoses:  Viral URI with cough     Discharge Instructions       You have a viral illness which will improve on its own with rest, fluids, and medications to help with your symptoms.  Tylenol , guaifenesin (plain mucinex), and saline nasal sprays may help relieve symptoms.   Two teaspoons of honey in 1 cup of warm water every 4-6 hours may help with throat pains.  Humidifier in room at nighttime may help soothe cough (clean well daily).   For chest pain, shortness of breath, inability to keep food or fluids down without vomiting, fever that does not respond to tylenol  or motrin, or any other severe symptoms, please go to the ER for further evaluation. Return to urgent care as needed, otherwise follow-up with PCP.     ED Prescriptions   None    PDMP not reviewed this encounter.   Enedelia Dorna HERO, FNP 03/25/24 1133    Enedelia Dorna HERO, [place] 03/25/24 1133

## 2024-03-25 NOTE — ED Triage Notes (Signed)
 Pt presents to UC for c/o sore throat, itchy ears, some nasal drainage starting yesterday. Took tylenol  last night and cough drops

## 2024-04-05 DIAGNOSIS — I5022 Chronic systolic (congestive) heart failure: Secondary | ICD-10-CM | POA: Diagnosis not present

## 2024-04-05 DIAGNOSIS — I495 Sick sinus syndrome: Secondary | ICD-10-CM | POA: Diagnosis not present

## 2024-04-05 DIAGNOSIS — Z01810 Encounter for preprocedural cardiovascular examination: Secondary | ICD-10-CM | POA: Diagnosis not present

## 2024-04-12 DIAGNOSIS — I495 Sick sinus syndrome: Secondary | ICD-10-CM | POA: Diagnosis not present

## 2024-04-12 DIAGNOSIS — Z950 Presence of cardiac pacemaker: Secondary | ICD-10-CM | POA: Diagnosis not present

## 2024-04-12 DIAGNOSIS — I4892 Unspecified atrial flutter: Secondary | ICD-10-CM | POA: Diagnosis not present

## 2024-04-12 DIAGNOSIS — I420 Dilated cardiomyopathy: Secondary | ICD-10-CM | POA: Diagnosis not present

## 2024-04-12 DIAGNOSIS — I5022 Chronic systolic (congestive) heart failure: Secondary | ICD-10-CM | POA: Diagnosis not present

## 2024-04-13 DIAGNOSIS — I5022 Chronic systolic (congestive) heart failure: Secondary | ICD-10-CM | POA: Diagnosis not present

## 2024-04-13 DIAGNOSIS — Z452 Encounter for adjustment and management of vascular access device: Secondary | ICD-10-CM | POA: Diagnosis not present

## 2024-04-13 DIAGNOSIS — Q208 Other congenital malformations of cardiac chambers and connections: Secondary | ICD-10-CM | POA: Diagnosis not present

## 2024-04-13 DIAGNOSIS — I484 Atypical atrial flutter: Secondary | ICD-10-CM | POA: Diagnosis not present

## 2024-04-13 DIAGNOSIS — J811 Chronic pulmonary edema: Secondary | ICD-10-CM | POA: Diagnosis not present

## 2024-04-13 DIAGNOSIS — I480 Paroxysmal atrial fibrillation: Secondary | ICD-10-CM | POA: Diagnosis not present

## 2024-04-13 DIAGNOSIS — Q203 Discordant ventriculoarterial connection: Secondary | ICD-10-CM | POA: Diagnosis not present

## 2024-04-13 DIAGNOSIS — Z950 Presence of cardiac pacemaker: Secondary | ICD-10-CM | POA: Diagnosis not present

## 2024-04-13 DIAGNOSIS — Z7901 Long term (current) use of anticoagulants: Secondary | ICD-10-CM | POA: Diagnosis not present

## 2024-04-13 DIAGNOSIS — I289 Disease of pulmonary vessels, unspecified: Secondary | ICD-10-CM | POA: Diagnosis not present

## 2024-04-13 DIAGNOSIS — Z8774 Personal history of (corrected) congenital malformations of heart and circulatory system: Secondary | ICD-10-CM | POA: Diagnosis not present

## 2024-04-20 ENCOUNTER — Inpatient Hospital Stay: Payer: Self-pay | Admitting: Nurse Practitioner

## 2024-04-25 ENCOUNTER — Encounter: Payer: Self-pay | Admitting: Nurse Practitioner

## 2024-04-25 ENCOUNTER — Ambulatory Visit: Payer: Self-pay | Admitting: Nurse Practitioner

## 2024-04-25 VITALS — BP 108/71 | HR 82 | Ht 62.0 in | Wt 126.2 lb

## 2024-04-25 DIAGNOSIS — E1169 Type 2 diabetes mellitus with other specified complication: Secondary | ICD-10-CM | POA: Diagnosis not present

## 2024-04-25 DIAGNOSIS — Z950 Presence of cardiac pacemaker: Secondary | ICD-10-CM | POA: Diagnosis not present

## 2024-04-25 DIAGNOSIS — I484 Atypical atrial flutter: Secondary | ICD-10-CM | POA: Diagnosis not present

## 2024-04-25 DIAGNOSIS — I10 Essential (primary) hypertension: Secondary | ICD-10-CM | POA: Diagnosis not present

## 2024-04-25 NOTE — Progress Notes (Addendum)
 Established Patient Office Visit  Subjective:  Patient ID: Brandi Fischer, female    DOB: 08-Dec-1965  Age: 58 y.o. MRN: [id number]  Chief Complaint  Patient presents with   Hospitalization Follow-up    Hospital follow up     HPI   Patient here today for a hospital follow up.  She had pacemaker placed at age 58 years of age.  She had ICD defibrillator 2 weeks ago at [facility] after 2019 cardioversion and 2021 atrial flutter.    No other concerns at this time.   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 grade   Unspecified chronic conjunctivitis, left eye     Past Surgical History:  Procedure Laterality Date   ABDOMINAL HYSTERECTOMY     APPENDECTOMY  10/2010   cardiac pacemaker procedure     CARDIAC SURGERY     TUBAL LIGATION      Social History   Socioeconomic History   Marital status: Married    Spouse name: Not on file   Number of children: Not on file   Years of education: Not on file   Highest education level: Not on file  Occupational History   Not on file  Tobacco Use   Smoking status: Never   Smokeless tobacco: Never  Vaping Use   Vaping status: Never Used  Substance and Sexual Activity   Alcohol use: No   Drug use: No   Sexual activity: Not on file  Other Topics Concern   Not on file  Social History Narrative   Not on file   Social Drivers of Health   Financial Resource Strain: Not on file  Food Insecurity: Not on file  Transportation Needs: Not on file  Physical Activity: Not on file  Stress: Not on file  Social Connections: Not on file  Intimate Partner Violence: Not on file    Family History  Problem Relation Age of Onset   Stroke Father    Diabetes Maternal Grandfather    Diabetes  Paternal Grandmother    Breast cancer Paternal Grandmother    Breast cancer Paternal Aunt    Colon cancer Neg Hx    Esophageal cancer Neg Hx    Rectal cancer Neg Hx    Stomach cancer Neg Hx     Allergies  Allergen Reactions   Shellfish Allergy     Hives    Outpatient Medications Prior to Visit  Medication Sig   Blood Glucose Monitoring Suppl DEVI 1 each by Does not apply route in the morning, at noon, and at bedtime. May substitute to any manufacturer covered by patient's insurance.   calcium -vitamin D  (OSCAL WITH D) 500-200 MG-UNIT per tablet Take 1 tablet by mouth.   digoxin  (LANOXIN ) 0.125 MG tablet TAKE 1 TABLET BY MOUTH EVERY DAY   ELIQUIS 5 MG TABS tablet TAKE ONE TABLET BY MOUTH EVERY 12 HOURS   furosemide  (LASIX ) 40 MG tablet Take 1 tablet (40 mg total) by mouth daily.   Glucose Blood (BLOOD GLUCOSE TEST STRIPS) STRP 1 each by In Vitro route in the morning, at noon, and at bedtime. May substitute to any manufacturer covered by patient's insurance.   JARDIANCE 25  MG TABS tablet TAKE ONE TABLET BY MOUTH EVERY DAY   Lancets Misc. MISC 1 each by Does not apply route in the morning, at noon, and at bedtime. May substitute to any manufacturer covered by patient's insurance.   latanoprost (XALATAN) 0.005 % ophthalmic solution Place 1 drop into both eyes at bedtime.   levalbuterol  (XOPENEX  HFA) 45 MCG/ACT inhaler Inhale 1 puff into the lungs every 6 (six) hours as needed for wheezing.   Magnesium (V-R MAGNESIUM) 250 MG TABS Take 250 mg by mouth daily.    metoprolol  succinate (TOPROL -XL) 25 MG 24 hr tablet Take 1 tablet (25 mg total) by mouth daily.   Multiple Vitamin (MULTIVITAMIN PO) Take 1 tablet by mouth daily.   rosuvastatin  (CRESTOR ) 5 MG tablet TAKE ONE TABLET BY MOUTH EVERY DAY   spironolactone  (ALDACTONE ) 25 MG tablet Take 1 tablet (25 mg total) by mouth daily.   triamcinolone  (KENALOG ) 0.025 % ointment Apply 1 Application topically 2 (two) times daily.   No  facility-administered medications prior to visit.    ROS     Objective:   BP 108/71   Pulse 82   Ht 5' 2 (1.575 m)   Wt 126 lb 3.2 oz (57.2 kg)   SpO2 98%   BMI 23.08 kg/m   Vitals:   04/25/24 0853  BP: 108/71  Pulse: 82  Height: 5' 2 (1.575 m)  Weight: 126 lb 3.2 oz (57.2 kg)  SpO2: 98%  BMI (Calculated): 23.08    Physical Exam Vitals and nursing note reviewed.  Constitutional:      Appearance: Normal appearance.  HENT:     Head: Normocephalic.     Nose: Nose normal.     Mouth/Throat:     Mouth: Mucous membranes are dry.  Eyes:     Extraocular Movements: Extraocular movements intact.     Pupils: Pupils are equal, round, and reactive to light.  Cardiovascular:     Rate and Rhythm: Normal rate and regular rhythm.     Pulses: Normal pulses.     Heart sounds: Normal heart sounds.  Pulmonary:     Effort: Pulmonary effort is normal.     Breath sounds: Normal breath sounds.  Abdominal:     General: Bowel sounds are normal.     Palpations: Abdomen is soft.  Musculoskeletal:        General: Tenderness present.     Cervical back: Normal range of motion and neck supple.  Skin:    General: Skin is warm and dry.     Findings: No bruising.  Neurological:     Mental Status: She is alert and oriented to person, place, and time.  Psychiatric:        Mood and Affect: Mood normal.        Behavior: Behavior normal.      No results found for any visits on 04/25/24.  Recent Results (from the past 2160 hours)  Microalbumin / creatinine urine ratio     Status: Abnormal   Collection Time: 02/06/24 10:09 AM  Result Value Ref Range   Creatinine, Urine 8.1 Not Estab. mg/dL   Microalbumin, Urine <6.9 Not Estab. ug/mL   Microalb/Creat Ratio Comment (A) 0 - 29 mg/g creat    Comment: The result is below the assay's limit of quantitation which may indicate a dilute specimen or other clinical condition. Consider recollection at a time likely to provide a urine that is  more concentrated.  Normal:                0 -  29                        Moderately increased: 30 - 300                        Severely increased:       >300   CBC with Differential/Platelet     Status: Abnormal   Collection Time: 02/06/24 10:10 AM  Result Value Ref Range   WBC 6.9 3.4 - 10.8 x10E3/uL   RBC 5.39 (H) 3.77 - 5.28 x10E6/uL   Hemoglobin 17.1 (H) 11.1 - 15.9 g/dL   Hematocrit 49.3 (H) 65.9 - 46.6 %   MCV 94 79 - 97 fL   MCH 31.7 26.6 - 33.0 pg   MCHC 33.8 31.5 - 35.7 g/dL   RDW 86.8 88.2 - 84.5 %   Platelets 146 (L) 150 - 450 x10E3/uL   Neutrophils 68 Not Estab. %   Lymphs 21 Not Estab. %   Monocytes 7 Not Estab. %   Eos 3 Not Estab. %   Basos 1 Not Estab. %   Neutrophils Absolute 4.6 1.4 - 7.0 x10E3/uL   Lymphocytes Absolute 1.4 0.7 - 3.1 x10E3/uL   Monocytes Absolute 0.5 0.1 - 0.9 x10E3/uL   EOS (ABSOLUTE) 0.2 0.0 - 0.4 x10E3/uL   Basophils Absolute 0.1 0.0 - 0.2 x10E3/uL   Immature Granulocytes 0 Not Estab. %   Immature Grans (Abs) 0.0 0.0 - 0.1 x10E3/uL  CMP14+EGFR     Status: Abnormal   Collection Time: 02/06/24 10:10 AM  Result Value Ref Range   Glucose 124 (H) 70 - 99 mg/dL   BUN 13 6 - 24 mg/dL   Creatinine, Ser 9.14 0.57 - 1.00 mg/dL   eGFR 79 >40 fO/fpw/8.26   BUN/Creatinine Ratio 15 9 - 23   Sodium 138 134 - 144 mmol/L   Potassium 4.3 3.5 - 5.2 mmol/L   Chloride 95 (L) 96 - 106 mmol/L   CO2 22 20 - 29 mmol/L   Calcium  9.9 8.7 - 10.2 mg/dL   Total Protein 8.1 6.0 - 8.5 g/dL   Albumin 5.3 (H) 3.8 - 4.9 g/dL   Globulin, Total 2.8 1.5 - 4.5 g/dL   Bilirubin Total 0.9 0.0 - 1.2 mg/dL   Alkaline Phosphatase 97 44 - 121 IU/L   AST 28 0 - 40 IU/L   ALT 24 0 - 32 IU/L  Hemoglobin A1c     Status: Abnormal   Collection Time: 02/06/24 10:10 AM  Result Value Ref Range   Hgb A1c MFr Bld 6.9 (H) 4.8 - 5.6 %    Comment:          Prediabetes: 5.7 - 6.4          Diabetes: >6.4          Glycemic control for adults with diabetes:  <7.0    Est. average glucose Bld gHb Est-mCnc 151 mg/dL  POC SARS Coronavirus 2 Ag-ED - Nasal Swab     Status: None   Collection Time: 03/25/24 11:10 AM  Result Value Ref Range   SARS Coronavirus 2 Ag Negative Negative      Assessment & Plan: 1) HTN 2) Atypical atrial flutter 3) Cardiac pacemaker in situ 4) DMII  Hosp f/u from [facility] for ICD defibrillator.   Follow up with [facility] already scheduled  in future Follow up with PCP in future as already scheduled   Problem List Items Addressed This Visit   None   No follow-ups on file.   Total time spent: 25 minutes  Neale Carpen, NP  04/25/2024   This document may have been prepared by [facility] Voice Recognition software and as such may include unintentional dictation errors.

## 2024-04-27 IMAGING — US US ABDOMEN LIMITED
1 series · 15 of 25 positions shown · non-contrast
Comparison: Ultrasound January 21, 2021; CT dated January 24, 2020

CLINICAL DATA: history of abnormal CT of liver

EXAM:
ULTRASOUND ABDOMEN LIMITED RIGHT UPPER QUADRANT

[Series 1: us abdomen limited ruq mc & wl · 73 acquisitions, 15 frames shown]
[im 1/73]
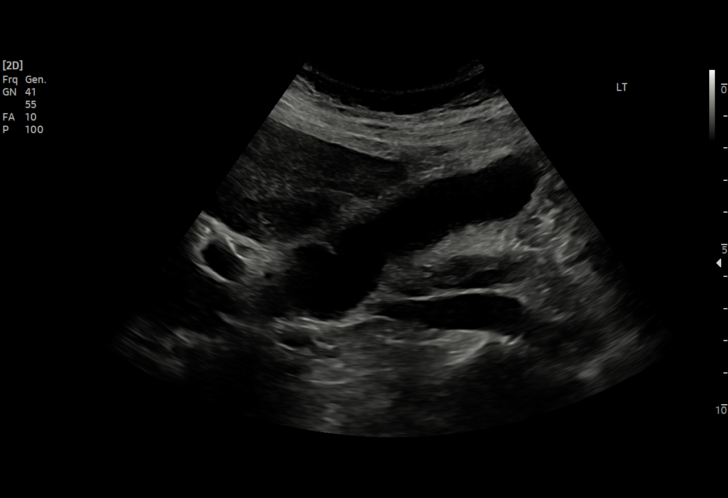
[im 7/73]
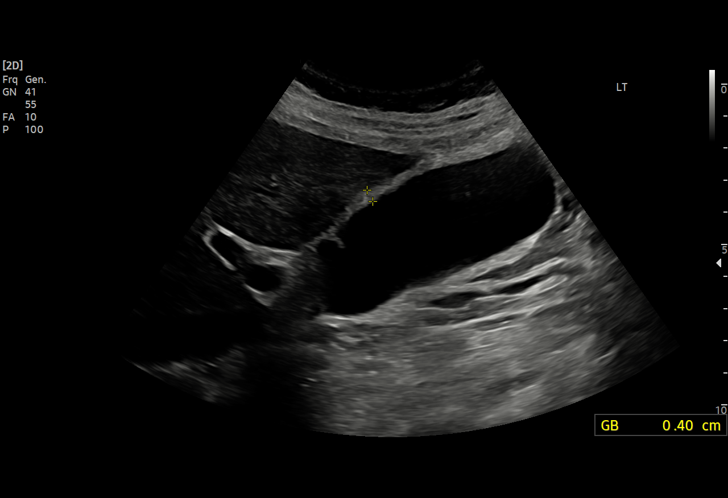
[im 13/73]
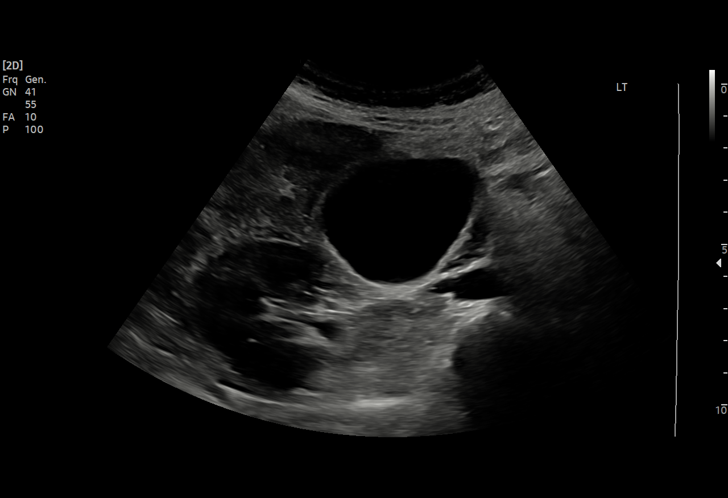
[im 16/73]
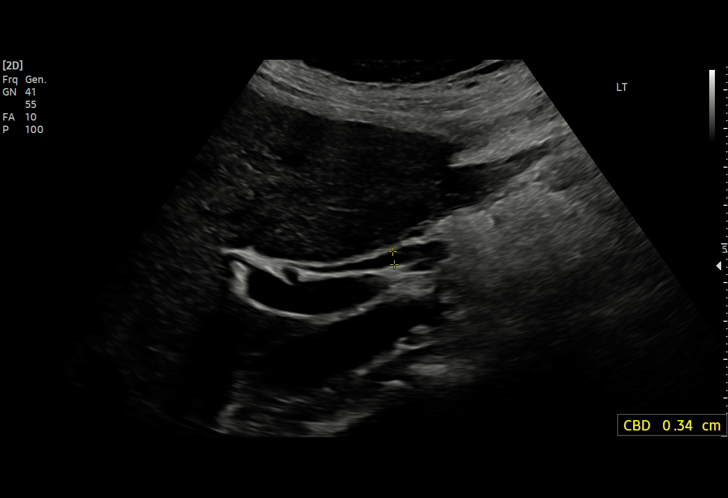
[im 22/73]
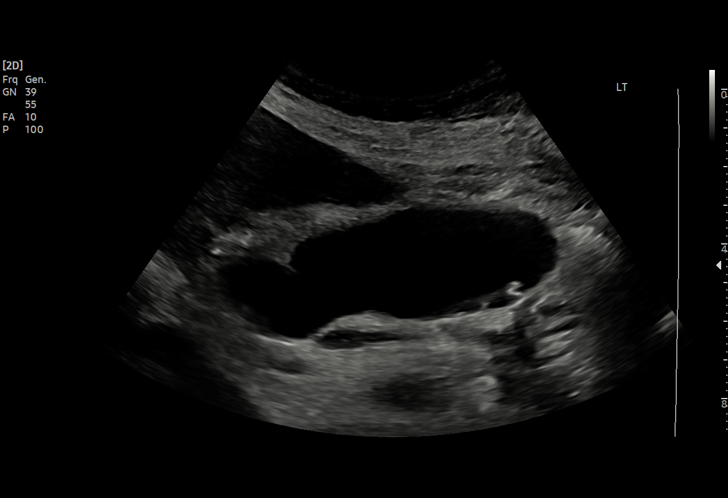
[im 28/73]
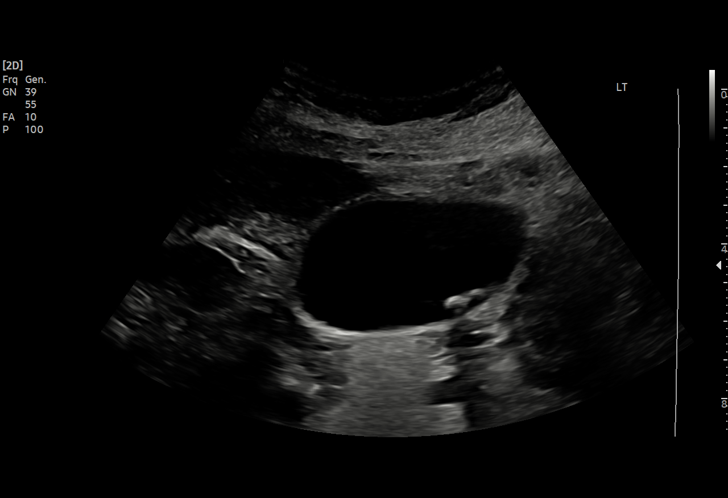
[im 31/73]
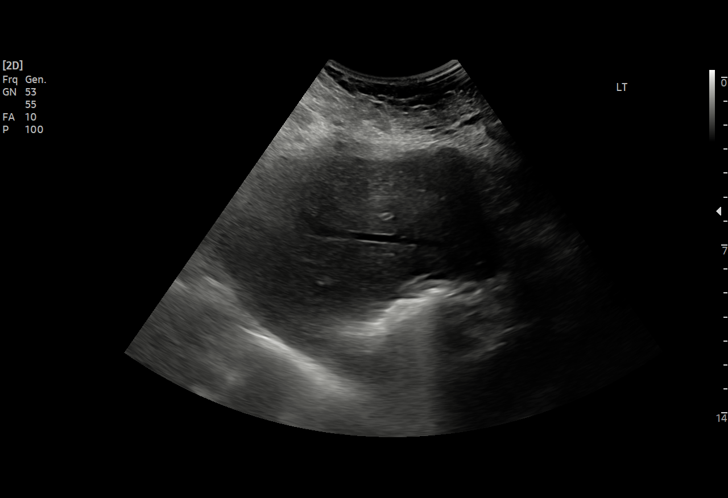
[im 37/73]
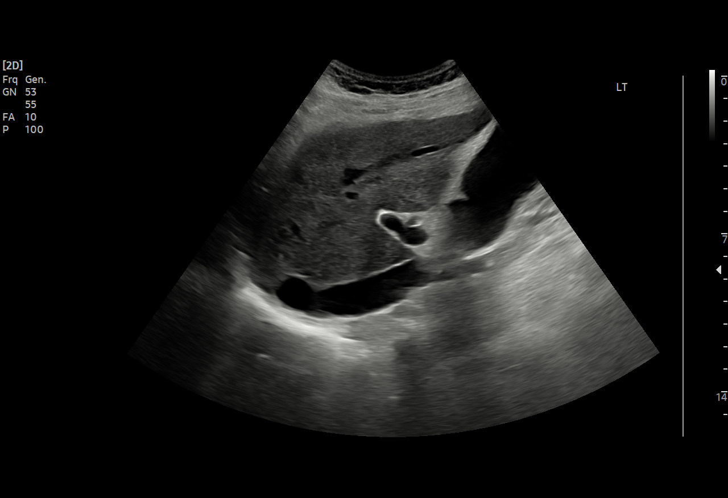
[im 43/73]
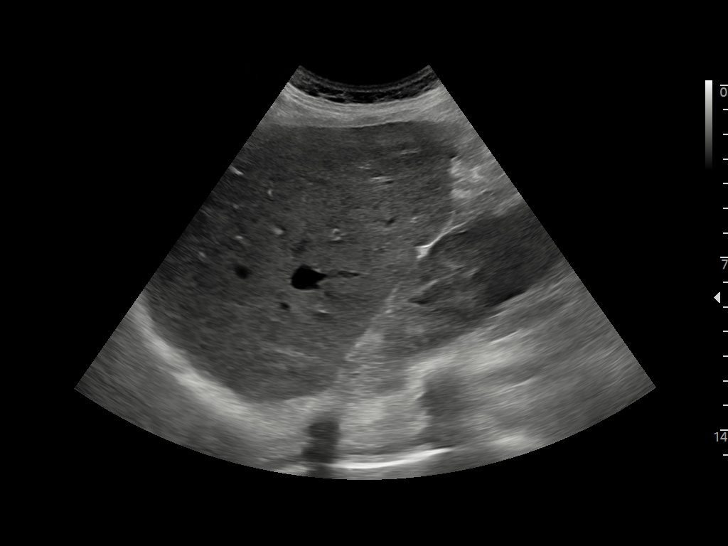
[im 46/73]
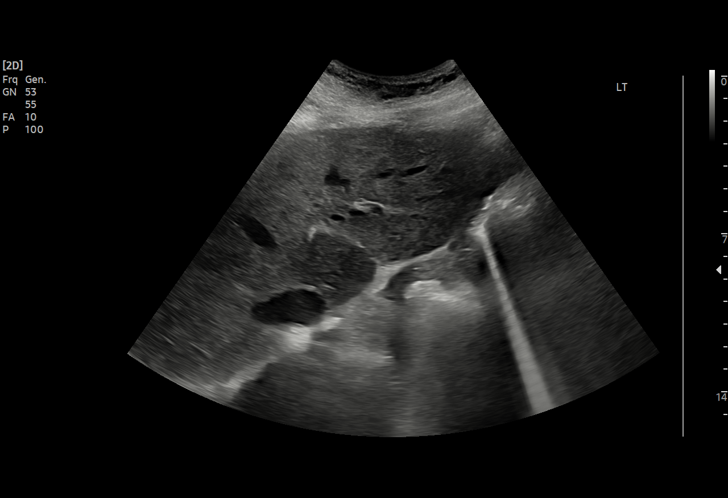
[im 52/73]
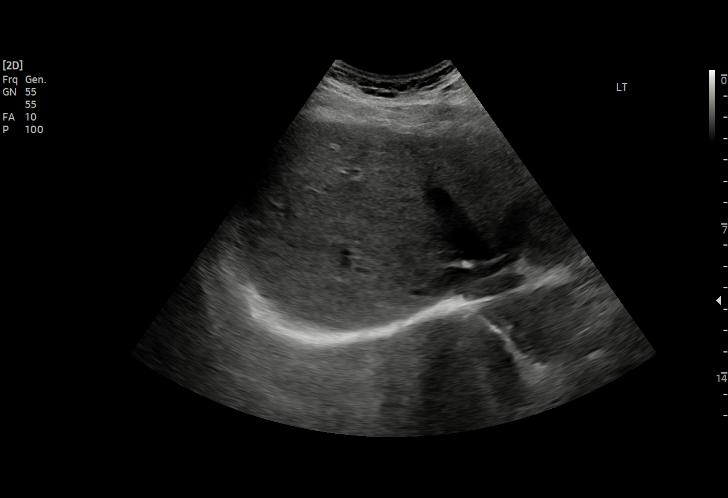
[im 58/73]
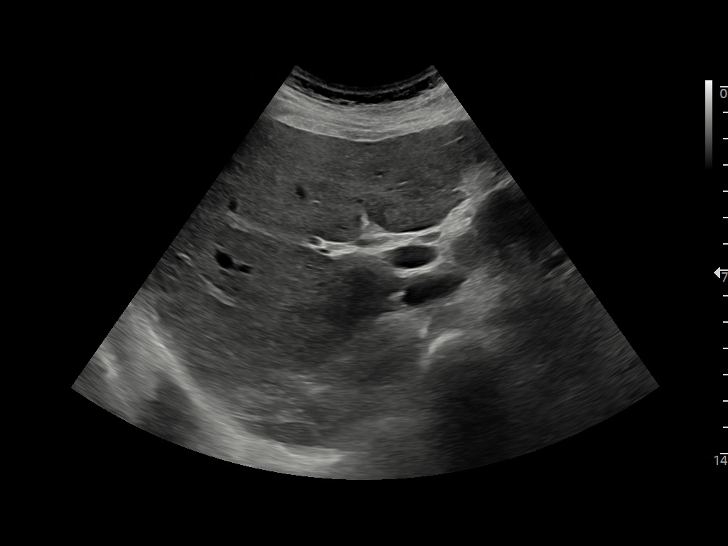
[im 61/73]
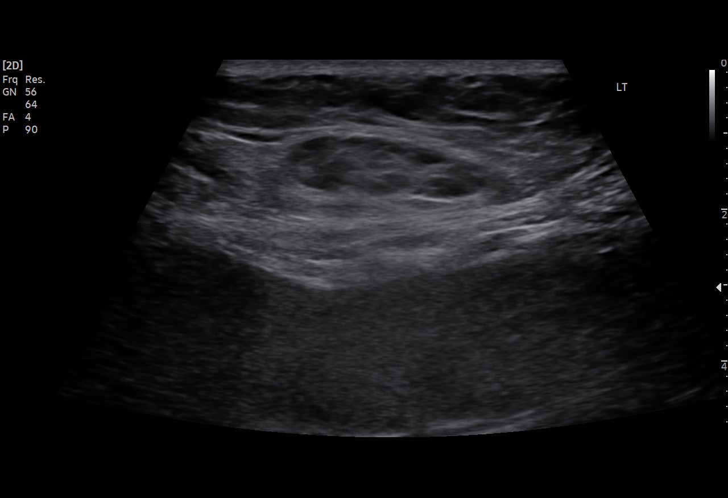
[im 67/73]
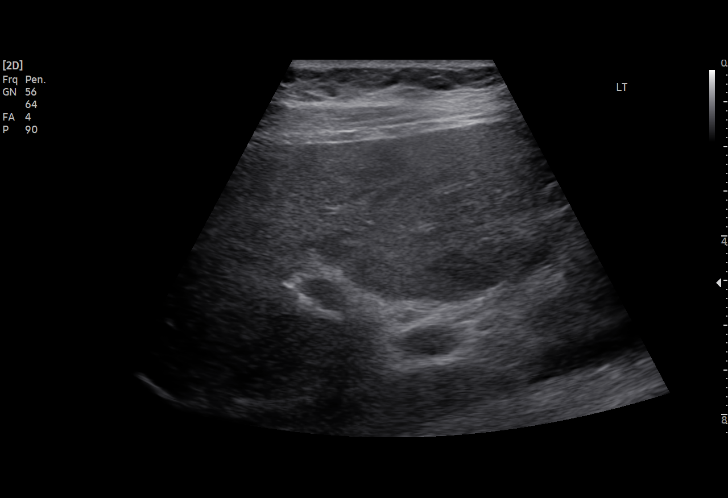
[im 73/73]
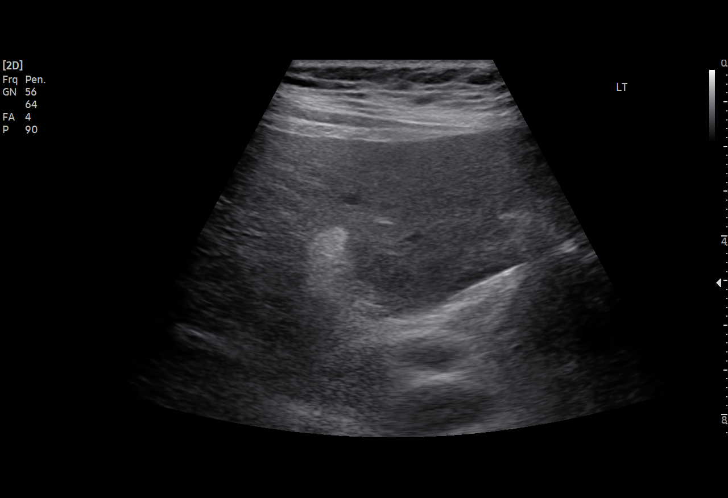

[15 of 25 positions shown; findings below may reference images not displayed]

FINDINGS: Gallbladder:

No wall thickening visualized when remeasured to exclude adjacent
fat. Cholelithiasis. No sonographic Murphy sign noted by
sonographer.

Common bile duct:

Diameter: Visualized portion measures 3 mm, within normal limits

Liver:

No discrete focal lesion identified. Single image with caliber
placement was not confirmed on longitudinal imaging. Heterogeneous
liver echotexture which is likely increased. There are lobular liver
contours with spacing of the porta hepatis and fissure. Portal vein
is patent on color Doppler imaging with normal direction of blood
flow towards the liver.

Other: None.
IMPRESSION: 1. Previously described echogenic mass is not discretely visualized
on today's exam. There are morphologic changes suggestive of
underlying cirrhosis. Given potential underlying cirrhosis and
sonographic heterogeneity, recommend further evaluation with
dedicated liver MRI with and without contrast for improved
evaluation given history of previous questioned lesion.
2. Cholelithiasis.

## 2024-05-22 DIAGNOSIS — I495 Sick sinus syndrome: Secondary | ICD-10-CM | POA: Diagnosis not present

## 2024-05-22 DIAGNOSIS — I484 Atypical atrial flutter: Secondary | ICD-10-CM | POA: Diagnosis not present

## 2024-05-22 DIAGNOSIS — Q203 Discordant ventriculoarterial connection: Secondary | ICD-10-CM | POA: Diagnosis not present

## 2024-05-22 DIAGNOSIS — I370 Nonrheumatic pulmonary valve stenosis: Secondary | ICD-10-CM | POA: Diagnosis not present

## 2024-05-22 DIAGNOSIS — Q208 Other congenital malformations of cardiac chambers and connections: Secondary | ICD-10-CM | POA: Diagnosis not present

## 2024-05-22 DIAGNOSIS — I480 Paroxysmal atrial fibrillation: Secondary | ICD-10-CM | POA: Diagnosis not present

## 2024-05-22 DIAGNOSIS — I5022 Chronic systolic (congestive) heart failure: Secondary | ICD-10-CM | POA: Diagnosis not present

## 2024-06-12 DIAGNOSIS — Z6822 Body mass index (BMI) 22.0-22.9, adult: Secondary | ICD-10-CM | POA: Diagnosis not present

## 2024-06-12 DIAGNOSIS — Z01419 Encounter for gynecological examination (general) (routine) without abnormal findings: Secondary | ICD-10-CM | POA: Diagnosis not present

## 2024-06-19 ENCOUNTER — Ambulatory Visit (INDEPENDENT_AMBULATORY_CARE_PROVIDER_SITE_OTHER): Admitting: Family Medicine

## 2024-06-19 ENCOUNTER — Ambulatory Visit: Payer: Self-pay

## 2024-06-19 ENCOUNTER — Encounter: Payer: Self-pay | Admitting: Family Medicine

## 2024-06-19 VITALS — BP 120/76 | HR 79 | Temp 97.9°F | Ht 62.0 in | Wt 124.0 lb

## 2024-06-19 DIAGNOSIS — L509 Urticaria, unspecified: Secondary | ICD-10-CM

## 2024-06-19 MED ORDER — PREDNISONE 20 MG PO TABS: ORAL_TABLET | ORAL | 0 refills | Status: DC

## 2024-06-19 NOTE — Telephone Encounter (Signed)
Patient advised UC.

## 2024-06-19 NOTE — Telephone Encounter (Signed)
 FYI Only or Action Required?: FYI only for provider.  Patient was last seen in primary care on 04/25/2024 by Glennon Sand, NP.  Called Nurse Triage reporting Rash.  Symptoms began today.  Interventions attempted: Nothing.  Symptoms are: unchanged.  Triage Disposition: See Physician Within 24 Hours  Patient/caregiver understands and will follow disposition?: Yes, no available OV, pt to be seen at [facility] today, agreeable.     Copied from CRM [id number]. Topic: Clinical - Red Word Triage >> Jun 19, 2024  8:14 AM Willma R wrote: [facility] that prompted transfer to Nurse Triage: Patient woke up this morning with a itchy and red rash on her back, chest, and arms. Reason for Disposition  SEVERE itching (i.e., interferes with sleep, normal activities or school)  Answer Assessment - Initial Assessment Questions 1. APPEARANCE of RASH: What does the rash look like? (e.g., blisters, dry flaky skin, red spots, redness, sores)     Red and splotchy 3. LOCATION: Where is the rash located?     Upper body, back, chest 4. COLOR: What color is the rash? (Note: It is difficult to assess rash color in people with darker-colored skin. When this situation occurs, simply ask the caller to describe what they see.)     Red, flat 5. ONSET: When did the rash begin?     This AM when waking 6. FEVER: Do you have a fever? If Yes, ask: What is your temperature, how was it measured, and when did it start?     None 7. ITCHING: Does the rash itch? If Yes, ask: How bad is the itch? (Scale 1-10; or mild, moderate, severe)     8/10 8. CAUSE: What do you think is causing the rash?     Unknown 9. MEDICINE FACTORS: Have you started any new medicines within the last 2 weeks? (e.g., antibiotics)      None 10. OTHER SYMPTOMS: Do you have any other symptoms? (e.g., dizziness, headache, sore throat, joint pain)       None  Protocols used: Rash or Redness - [facility]

## 2024-06-19 NOTE — Progress Notes (Signed)
 Subjective:    Patient ID: Brandi Fischer, female    DOB: 07-Mar-1966, 58 y.o.   MRN: [id number]  Rash    Patient normally sees another physician.  She is working today due to a diffuse morbilliform rash all over her chest and back and abdomen.  Started Saturday.  She reports diffuse itching all over her chest abdomen and back.  She has a maculopapular rash similar to a drug rash versus a viral exanthem but it is limited to her chest and torso.  There is no rash on her arms or legs.  She denies any swelling of her lips or tongue.  She denies any trouble breathing.  She denies any fevers or chills.  She denies any rhinorrhea.  She denies any recent congestion.  She did have a tick that bit her 2 weeks ago.  There is no evidence of erythema migrans.  There is no evidence of [facility] spotted fever.  She denies any headache or neck stiffness. 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 grade   Unspecified chronic conjunctivitis, left eye    Past Surgical History:  Procedure Laterality Date   ABDOMINAL HYSTERECTOMY     APPENDECTOMY  10/2010   cardiac pacemaker procedure     CARDIAC SURGERY     TUBAL LIGATION     Current Outpatient Medications on File Prior to Visit  Medication Sig Dispense Refill   Blood Glucose Monitoring Suppl DEVI 1 each by Does not apply route in the morning, at noon, and at bedtime. May substitute to any manufacturer covered by patient's insurance. 1 each 0   calcium -vitamin D  (OSCAL WITH D) 500-200 MG-UNIT per tablet Take 1 tablet by mouth.     digoxin  (LANOXIN ) 0.125 MG tablet TAKE 1 TABLET BY MOUTH EVERY DAY 30 tablet 2   ELIQUIS 5 MG TABS tablet TAKE ONE TABLET BY MOUTH EVERY 12 HOURS 180 tablet 3    furosemide  (LASIX ) 40 MG tablet Take 1 tablet (40 mg total) by mouth daily. 90 tablet 1   Glucose Blood (BLOOD GLUCOSE TEST STRIPS) STRP 1 each by In Vitro route in the morning, at noon, and at bedtime. May substitute to any manufacturer covered by patient's insurance. 100 strip 0   JARDIANCE 25 MG TABS tablet TAKE ONE TABLET BY MOUTH EVERY DAY 30 tablet 3   Lancets Misc. MISC 1 each by Does not apply route in the morning, at noon, and at bedtime. May substitute to any manufacturer covered by patient's insurance. 100 each 0   latanoprost (XALATAN) 0.005 % ophthalmic solution Place 1 drop into both eyes at bedtime.     levalbuterol  (XOPENEX  HFA) 45 MCG/ACT inhaler Inhale 1 puff into the lungs every 6 (six) hours as needed for wheezing. 1 each 12   Magnesium (V-R MAGNESIUM) 250 MG TABS Take 250 mg by mouth daily.      metoprolol  succinate (TOPROL -XL) 25 MG 24 hr tablet Take 1 tablet (25 mg total) by mouth daily. 90 tablet 1   Multiple Vitamin (MULTIVITAMIN PO) Take 1 tablet by mouth daily.     rosuvastatin  (CRESTOR ) 5 MG tablet TAKE ONE TABLET BY MOUTH EVERY DAY 30  tablet 3   spironolactone  (ALDACTONE ) 25 MG tablet Take 1 tablet (25 mg total) by mouth daily. 30 tablet 5   triamcinolone  (KENALOG ) 0.025 % ointment Apply 1 Application topically 2 (two) times daily. 30 g 0   No current facility-administered medications on file prior to visit.   Allergies  Allergen Reactions   Shellfish Allergy     Hives   Social History   Socioeconomic History   Marital status: Married    Spouse name: Not on file   Number of children: Not on file   Years of education: Not on file   Highest education level: Not on file  Occupational History   Not on file  Tobacco Use   Smoking status: Never   Smokeless tobacco: Never  Vaping Use   Vaping status: Never Used  Substance and Sexual Activity   Alcohol use: No   Drug use: No   Sexual activity: Not on file  Other Topics Concern   Not on file  Social  History Narrative   Not on file   Social Drivers of Health   Financial Resource Strain: Not on file  Food Insecurity: Not on file  Transportation Needs: Not on file  Physical Activity: Not on file  Stress: Not on file  Social Connections: Not on file  Intimate Partner Violence: Not on file     Review of Systems  Skin:  Positive for rash.  All other systems reviewed and are negative.      Objective:   Physical Exam Constitutional:      Appearance: Normal appearance. She is normal weight.  HENT:     Nose: No congestion or rhinorrhea.     Mouth/Throat:     Pharynx: No oropharyngeal exudate or posterior oropharyngeal erythema.  Cardiovascular:     Rate and Rhythm: Normal rate and regular rhythm.  Pulmonary:     Effort: Pulmonary effort is normal.     Breath sounds: Normal breath sounds. No wheezing, rhonchi or rales.  Musculoskeletal:     Cervical back: Neck supple.  Lymphadenopathy:     Cervical: No cervical adenopathy.  Skin:    Findings: Rash present. Rash is macular, papular and urticarial.      Neurological:     Mental Status: She is alert.           Assessment & Plan:   Urticaria - Plan: Alpha-Gal Panel  Patient has a diffuse maculopapular urticarial-like rash all over her chest and abdomen and back.  Differential diagnosis includes drug rash, viral exanthem, urticaria.  Given the recent tick bite, I am concerned about possible alpha gal.  I will check labs for this.  Meanwhile start prednisone  taper pack.  If she develops fevers or chills or signs of systemic illness she needs to be seen immediately.  There is no evidence of erythema migrans or symptoms of [facility] spotted fever.  We discussed checking tick titers but she declines this at the present time

## 2024-06-22 LAB — ALPHA-GAL PANEL
Allergen, Mutton, f88: 0.34 kU/L — ABNORMAL HIGH
Allergen, Pork, f26: 0.29 kU/L — ABNORMAL HIGH
Beef: 0.72 kU/L — ABNORMAL HIGH
CLASS: 2
GALACTOSE-ALPHA-1,3-GALACTOSE IGE*: 3.94 kU/L — ABNORMAL HIGH (ref <0.10)

## 2024-06-22 LAB — INTERPRETATION:

## 2024-06-25 ENCOUNTER — Ambulatory Visit: Payer: Self-pay | Admitting: Family Medicine

## 2024-06-26 ENCOUNTER — Other Ambulatory Visit: Payer: Self-pay | Admitting: Internal Medicine

## 2024-06-27 ENCOUNTER — Encounter: Payer: Self-pay | Admitting: Internal Medicine

## 2024-06-27 ENCOUNTER — Telehealth: Payer: Self-pay

## 2024-06-27 NOTE — Telephone Encounter (Signed)
 Copied from CRM #[id number]. Topic: Clinical - Medical Advice >> Jun 27, 2024  2:39 PM Emylou G wrote: Reason for CRM: Patient called.. did schedule an acute visit for 10/21 for draining / rash - I have her on waitlist.  Pls call patient.. wants to know what she can put on it at night to help prevent the draining when she sleeps - maybe type of bandage or suggestion.. she has sensitive skin

## 2024-07-04 ENCOUNTER — Other Ambulatory Visit: Payer: Self-pay

## 2024-07-04 MED ORDER — DIGOXIN 125 MCG PO TABS: ORAL_TABLET | ORAL | 2 refills | Status: DC

## 2024-07-08 ENCOUNTER — Other Ambulatory Visit: Payer: Self-pay | Admitting: Internal Medicine

## 2024-07-08 DIAGNOSIS — E782 Mixed hyperlipidemia: Secondary | ICD-10-CM

## 2024-07-09 ENCOUNTER — Encounter: Payer: Self-pay | Admitting: Internal Medicine

## 2024-07-09 ENCOUNTER — Ambulatory Visit (INDEPENDENT_AMBULATORY_CARE_PROVIDER_SITE_OTHER): Payer: Self-pay | Admitting: Internal Medicine

## 2024-07-09 VITALS — BP 111/62 | HR 81 | Ht 62.0 in | Wt 123.0 lb

## 2024-07-09 DIAGNOSIS — I5022 Chronic systolic (congestive) heart failure: Secondary | ICD-10-CM

## 2024-07-09 DIAGNOSIS — E559 Vitamin D deficiency, unspecified: Secondary | ICD-10-CM | POA: Diagnosis not present

## 2024-07-09 DIAGNOSIS — Z7984 Long term (current) use of oral hypoglycemic drugs: Secondary | ICD-10-CM

## 2024-07-09 DIAGNOSIS — E782 Mixed hyperlipidemia: Secondary | ICD-10-CM

## 2024-07-09 DIAGNOSIS — E1169 Type 2 diabetes mellitus with other specified complication: Secondary | ICD-10-CM | POA: Diagnosis not present

## 2024-07-09 DIAGNOSIS — Z23 Encounter for immunization: Secondary | ICD-10-CM | POA: Diagnosis not present

## 2024-07-09 DIAGNOSIS — D751 Secondary polycythemia: Secondary | ICD-10-CM

## 2024-07-09 DIAGNOSIS — Z91018 Allergy to other foods: Secondary | ICD-10-CM | POA: Diagnosis not present

## 2024-07-09 NOTE — Assessment & Plan Note (Signed)
On Crestor Check lipid profile Advised to follow DASH diet 

## 2024-07-09 NOTE — Assessment & Plan Note (Signed)
 Last echo (09/24) reviewed, showed moderate LV dysfunction -similar compared to prior Followed by cardiology-last visit note reviewed Appears euvolemic currently On Jardiance 25 mg QD, spironolactone 25 mg QD and Lasix 40 mg QD Did not tolerate ACEi/ARB

## 2024-07-09 NOTE — Assessment & Plan Note (Addendum)
 Recent blood test reviewed, has mild alpha gal allergy likely from recent tick bite Advised to avoid red meat products for now Benadryl as needed for rash May repeat titers after 6 months

## 2024-07-09 NOTE — Assessment & Plan Note (Signed)
 Lab Results  Component Value Date   HGBA1C 6.9 (H) 02/06/2024   Well-controlled Associated with HTN, CHF and HLD On Jardiance 25 mg once daily Has glucometer and supplies for intermittent checking of blood glucose Advised to follow diabetic diet On statin and Spironolactone  F/u CMP and lipid panel Diabetic eye exam: Advised to follow up with Ophthalmology for diabetic eye exam

## 2024-07-09 NOTE — Progress Notes (Signed)
 Acute Office Visit  Subjective:    Patient ID: Brandi Fischer, female    DOB: 12/03/1965, 58 y.o.   MRN: [id number]  Chief Complaint  Patient presents with   Rash    Reports rash is better.    Drainage from Incision    Reports drainage from incision stopped towards the end of September.     HPI Patient is in today for follow-up of rash likely due to alpha gal allergy.  She went to see Dr. Duanne on 06/19/24 after tick bite around 06/04/24 on her left arm.  She did not have any localized rash, fever, chills or fatigue immediately after the rash, but she developed generalized rash on the chest, abdomen and back 2 weeks later.  Due to recent tick bite, she had workup for alpha gal allergy, which was weakly positive.  She has completed oral prednisone  course and has tried to avoid red meat products since then.  She ate steak 1 time unknowingly before her allergy test resulted, but did not have a rash at that time.  She also mentions having drainage from incision site around ICD placement for about a month, which has stopped since the last week.  She denies any local bleeding or discharge now.  Denies fever or chills.  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 grade   Unspecified chronic conjunctivitis, left eye     Past Surgical History:  Procedure Laterality Date   ABDOMINAL HYSTERECTOMY     APPENDECTOMY  10/2010   cardiac pacemaker procedure     CARDIAC SURGERY     TUBAL LIGATION      Family History  Problem Relation Age of Onset   Stroke Father    Diabetes Maternal Grandfather    Diabetes Paternal Grandmother    Breast cancer Paternal Grandmother    Breast cancer Paternal Aunt    Colon cancer Neg Hx    Esophageal  cancer Neg Hx    Rectal cancer Neg Hx    Stomach cancer Neg Hx     Social History   Socioeconomic History   Marital status: Married    Spouse name: Not on file   Number of children: Not on file   Years of education: Not on file   Highest education level: Not on file  Occupational History   Not on file  Tobacco Use   Smoking status: Never   Smokeless tobacco: Never  Vaping Use   Vaping status: Never Used  Substance and Sexual Activity   Alcohol use: No   Drug use: No   Sexual activity: Not on file  Other Topics Concern   Not on file  Social History Narrative   Not on file   Social Drivers of Health   Financial Resource Strain: Not on file  Food Insecurity: Not on file  Transportation Needs: Not on file  Physical Activity: Not on file  Stress: Not on file  Social Connections: Not on file  Intimate Partner Violence: Not on file    Outpatient Medications Prior to Visit  Medication Sig Dispense Refill   Blood Glucose Monitoring Suppl DEVI 1 each by Does not apply route in the  morning, at noon, and at bedtime. May substitute to any manufacturer covered by patient's insurance. 1 each 0   calcium -vitamin D  (OSCAL WITH D) 500-200 MG-UNIT per tablet Take 1 tablet by mouth.     digoxin  (LANOXIN ) 0.125 MG tablet TAKE 1 TABLET BY MOUTH EVERY DAY 30 tablet 2   ELIQUIS 5 MG TABS tablet TAKE ONE TABLET BY MOUTH EVERY 12 HOURS 180 tablet 3   furosemide  (LASIX ) 40 MG tablet Take 1 tablet (40 mg total) by mouth daily. 90 tablet 1   Glucose Blood (BLOOD GLUCOSE TEST STRIPS) STRP 1 each by In Vitro route in the morning, at noon, and at bedtime. May substitute to any manufacturer covered by patient's insurance. 100 strip 0   JARDIANCE 25 MG TABS tablet TAKE ONE TABLET BY MOUTH EVERY DAY 30 tablet 3   Lancets Misc. MISC 1 each by Does not apply route in the morning, at noon, and at bedtime. May substitute to any manufacturer covered by patient's insurance. 100 each 0   latanoprost  (XALATAN) 0.005 % ophthalmic solution Place 1 drop into both eyes at bedtime.     levalbuterol  (XOPENEX  HFA) 45 MCG/ACT inhaler Inhale 1 puff into the lungs every 6 (six) hours as needed for wheezing. 1 each 12   Magnesium (V-R MAGNESIUM) 250 MG TABS Take 250 mg by mouth daily.      metoprolol  succinate (TOPROL -XL) 25 MG 24 hr tablet Take 1 tablet (25 mg total) by mouth daily. 90 tablet 1   Multiple Vitamin (MULTIVITAMIN PO) Take 1 tablet by mouth daily.     predniSONE  (DELTASONE ) 20 MG tablet 3 tabs poqday 1-2, 2 tabs poqday 3-4, 1 tab poqday 5-6 12 tablet 0   rosuvastatin  (CRESTOR ) 5 MG tablet TAKE ONE TABLET BY MOUTH EVERY DAY 30 tablet 3   spironolactone  (ALDACTONE ) 25 MG tablet Take 1 tablet (25 mg total) by mouth daily. 30 tablet 5   triamcinolone  (KENALOG ) 0.025 % ointment Apply 1 Application topically 2 (two) times daily. 30 g 0   No facility-administered medications prior to visit.    Allergies  Allergen Reactions   Shellfish Allergy     Hives    Review of Systems  Constitutional:  Negative for chills and fever.  HENT:  Negative for congestion, sinus pressure, sinus pain and sore throat.   Eyes:  Negative for pain and discharge.  Respiratory:  Negative for cough and shortness of breath.   Cardiovascular:  Negative for chest pain, palpitations and leg swelling.  Gastrointestinal:  Negative for abdominal pain, diarrhea, nausea and vomiting.  Endocrine: Negative for polydipsia and polyuria.  Genitourinary:  Negative for dysuria and hematuria.  Musculoskeletal:  Negative for neck pain and neck stiffness.  Skin:  Negative for rash.  Neurological:  Negative for dizziness, syncope, weakness and numbness.  Psychiatric/Behavioral:  Negative for agitation and behavioral problems.        Objective:    Physical Exam Vitals reviewed.  Constitutional:      General: She is not in acute distress.    Appearance: She is not diaphoretic.  HENT:     Head: Normocephalic and atraumatic.      Nose: Nose normal. No congestion.     Mouth/Throat:     Mouth: Mucous membranes are moist.     Pharynx: No posterior oropharyngeal erythema.  Eyes:     General: No scleral icterus.    Extraocular Movements: Extraocular movements intact.  Cardiovascular:     Rate and Rhythm: Normal rate and  regular rhythm.     Pulses: Normal pulses.     Heart sounds: Murmur (Systolic (pronounced in left upper sternal border)) heard.  Pulmonary:     Breath sounds: No wheezing or rales.  Musculoskeletal:     Cervical back: Neck supple. No rigidity or tenderness.     Right lower leg: No edema.     Left lower leg: No edema.  Skin:    General: Skin is warm.     Findings: Bruising (Upper left chest wall area around incision site, appears dry and intact) present. No rash.  Neurological:     General: No focal deficit present.     Mental Status: She is alert and oriented to person, place, and time.     Sensory: No sensory deficit.     Motor: No weakness.  Psychiatric:        Mood and Affect: Mood normal.        Behavior: Behavior normal.     BP 111/62   Pulse 81   Ht 5' 2 (1.575 m)   Wt 123 lb (55.8 kg)   SpO2 98%   BMI 22.50 kg/m  Wt Readings from Last 3 Encounters:  07/09/24 123 lb (55.8 kg)  06/19/24 124 lb (56.2 kg)  04/25/24 126 lb 3.2 oz (57.2 kg)        Assessment & Plan:   Problem List Items Addressed This Visit       Cardiovascular and Mediastinum   Chronic systolic CHF (congestive heart failure) (HCC)   Last echo (09/24) reviewed, showed moderate LV dysfunction -similar compared to prior Followed by cardiology-last visit note reviewed Appears euvolemic currently On Jardiance 25 mg QD, spironolactone  25 mg QD and Lasix  40 mg QD Did not tolerate ACEi/ARB      Relevant Orders   TSH   CMP14+EGFR   CBC with Differential/Platelet     Endocrine   Type 2 diabetes mellitus with other specified complication   Lab Results  Component Value Date   HGBA1C 6.9 (H)  02/06/2024   Well-controlled Associated with HTN, CHF and HLD On Jardiance 25 mg once daily Has glucometer and supplies for intermittent checking of blood glucose Advised to follow diabetic diet On statin and Spironolactone  F/u CMP and lipid panel Diabetic eye exam: Advised to follow up with Ophthalmology for diabetic eye exam      Relevant Orders   Hemoglobin A1c   CMP14+EGFR     Other   Mixed hyperlipidemia   On Crestor  Check lipid profile Advised to follow DASH diet      Relevant Orders   Lipid panel   Erythrocytosis   Likely due to congenital heart disease Check CBC      Relevant Orders   CBC with Differential/Platelet   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    Suzzane MARLA Blanch, MD

## 2024-07-09 NOTE — Patient Instructions (Signed)
 Please avoid red meat products for now.  Please continue to take medications as prescribed.  Please continue to follow low carb diet and perform moderate exercise/walking at least 150 mins/week.

## 2024-07-09 NOTE — Assessment & Plan Note (Signed)
 Likely due to congenital heart disease Check CBC

## 2024-07-13 DIAGNOSIS — Z4502 Encounter for adjustment and management of automatic implantable cardiac defibrillator: Secondary | ICD-10-CM | POA: Diagnosis not present

## 2024-07-13 DIAGNOSIS — I5022 Chronic systolic (congestive) heart failure: Secondary | ICD-10-CM | POA: Diagnosis not present

## 2024-07-13 DIAGNOSIS — I495 Sick sinus syndrome: Secondary | ICD-10-CM | POA: Diagnosis not present

## 2024-07-13 DIAGNOSIS — Z95810 Presence of automatic (implantable) cardiac defibrillator: Secondary | ICD-10-CM | POA: Diagnosis not present

## 2024-07-13 DIAGNOSIS — Q208 Other congenital malformations of cardiac chambers and connections: Secondary | ICD-10-CM | POA: Diagnosis not present

## 2024-07-13 DIAGNOSIS — Q203 Discordant ventriculoarterial connection: Secondary | ICD-10-CM | POA: Diagnosis not present

## 2024-07-13 DIAGNOSIS — I480 Paroxysmal atrial fibrillation: Secondary | ICD-10-CM | POA: Diagnosis not present

## 2024-07-13 DIAGNOSIS — I484 Atypical atrial flutter: Secondary | ICD-10-CM | POA: Diagnosis not present

## 2024-07-13 DIAGNOSIS — I370 Nonrheumatic pulmonary valve stenosis: Secondary | ICD-10-CM | POA: Diagnosis not present

## 2024-07-17 DIAGNOSIS — Q203 Discordant ventriculoarterial connection: Secondary | ICD-10-CM | POA: Diagnosis not present

## 2024-07-17 DIAGNOSIS — I480 Paroxysmal atrial fibrillation: Secondary | ICD-10-CM | POA: Diagnosis not present

## 2024-07-17 DIAGNOSIS — Z95810 Presence of automatic (implantable) cardiac defibrillator: Secondary | ICD-10-CM | POA: Diagnosis not present

## 2024-07-17 DIAGNOSIS — Z4502 Encounter for adjustment and management of automatic implantable cardiac defibrillator: Secondary | ICD-10-CM | POA: Diagnosis not present

## 2024-07-17 DIAGNOSIS — I370 Nonrheumatic pulmonary valve stenosis: Secondary | ICD-10-CM | POA: Diagnosis not present

## 2024-07-17 DIAGNOSIS — I5022 Chronic systolic (congestive) heart failure: Secondary | ICD-10-CM | POA: Diagnosis not present

## 2024-07-17 DIAGNOSIS — I495 Sick sinus syndrome: Secondary | ICD-10-CM | POA: Diagnosis not present

## 2024-07-17 DIAGNOSIS — Q208 Other congenital malformations of cardiac chambers and connections: Secondary | ICD-10-CM | POA: Diagnosis not present

## 2024-07-17 DIAGNOSIS — I484 Atypical atrial flutter: Secondary | ICD-10-CM | POA: Diagnosis not present

## 2024-07-18 ENCOUNTER — Other Ambulatory Visit: Payer: Self-pay | Admitting: Internal Medicine

## 2024-07-18 ENCOUNTER — Other Ambulatory Visit: Payer: Self-pay

## 2024-07-18 ENCOUNTER — Encounter: Payer: Self-pay | Admitting: Internal Medicine

## 2024-07-18 DIAGNOSIS — I5022 Chronic systolic (congestive) heart failure: Secondary | ICD-10-CM

## 2024-07-18 DIAGNOSIS — R600 Localized edema: Secondary | ICD-10-CM

## 2024-07-18 MED ORDER — SPIRONOLACTONE 25 MG PO TABS: 25.0000 mg | ORAL_TABLET | Freq: Every day | ORAL | 5 refills | Status: AC

## 2024-07-24 ENCOUNTER — Ambulatory Visit: Payer: Self-pay | Admitting: Internal Medicine

## 2024-08-07 DIAGNOSIS — R222 Localized swelling, mass and lump, trunk: Secondary | ICD-10-CM | POA: Diagnosis not present

## 2024-08-07 DIAGNOSIS — Z95810 Presence of automatic (implantable) cardiac defibrillator: Secondary | ICD-10-CM | POA: Diagnosis not present

## 2024-08-09 DIAGNOSIS — I5022 Chronic systolic (congestive) heart failure: Secondary | ICD-10-CM | POA: Diagnosis not present

## 2024-08-09 DIAGNOSIS — E1169 Type 2 diabetes mellitus with other specified complication: Secondary | ICD-10-CM | POA: Diagnosis not present

## 2024-08-09 DIAGNOSIS — D751 Secondary polycythemia: Secondary | ICD-10-CM | POA: Diagnosis not present

## 2024-08-09 DIAGNOSIS — E559 Vitamin D deficiency, unspecified: Secondary | ICD-10-CM | POA: Diagnosis not present

## 2024-08-09 DIAGNOSIS — E782 Mixed hyperlipidemia: Secondary | ICD-10-CM | POA: Diagnosis not present

## 2024-08-09 DIAGNOSIS — H40023 Open angle with borderline findings, high risk, bilateral: Secondary | ICD-10-CM | POA: Diagnosis not present

## 2024-08-10 LAB — CMP14+EGFR
ALT: 22 IU/L (ref 0–32)
AST: 26 IU/L (ref 0–40)
Albumin: 5.2 g/dL — ABNORMAL HIGH (ref 3.8–4.9)
Alkaline Phosphatase: 70 IU/L (ref 49–135)
BUN/Creatinine Ratio: 18 (ref 9–23)
BUN: 18 mg/dL (ref 6–24)
Bilirubin Total: 1.1 mg/dL (ref 0.0–1.2)
CO2: 22 mmol/L (ref 20–29)
Calcium: 10 mg/dL (ref 8.7–10.2)
Chloride: 97 mmol/L (ref 96–106)
Creatinine, Ser: 1 mg/dL (ref 0.57–1.00)
Globulin, Total: 2.3 g/dL (ref 1.5–4.5)
Glucose: 123 mg/dL — ABNORMAL HIGH (ref 70–99)
Potassium: 4 mmol/L (ref 3.5–5.2)
Sodium: 137 mmol/L (ref 134–144)
Total Protein: 7.5 g/dL (ref 6.0–8.5)
eGFR: 65 mL/min/1.73 (ref >59)

## 2024-08-10 LAB — CBC WITH DIFFERENTIAL/PLATELET
Basophils Absolute: 0.1 x10E3/uL (ref 0.0–0.2)
Basos: 1 %
EOS (ABSOLUTE): 0.2 x10E3/uL (ref 0.0–0.4)
Eos: 2 %
Hematocrit: 49.5 % — ABNORMAL HIGH (ref 34.0–46.6)
Hemoglobin: 16.4 g/dL — ABNORMAL HIGH (ref 11.1–15.9)
Immature Grans (Abs): 0 x10E3/uL (ref 0.0–0.1)
Immature Granulocytes: 0 %
Lymphocytes Absolute: 1.2 x10E3/uL (ref 0.7–3.1)
Lymphs: 17 %
MCH: 31.4 pg (ref 26.6–33.0)
MCHC: 33.1 g/dL (ref 31.5–35.7)
MCV: 95 fL (ref 79–97)
Monocytes Absolute: 0.5 x10E3/uL (ref 0.1–0.9)
Monocytes: 7 %
Neutrophils Absolute: 5 x10E3/uL (ref 1.4–7.0)
Neutrophils: 73 %
Platelets: 144 x10E3/uL — ABNORMAL LOW (ref 150–450)
RBC: 5.23 x10E6/uL (ref 3.77–5.28)
RDW: 13.7 % (ref 11.7–15.4)
WBC: 6.9 x10E3/uL (ref 3.4–10.8)

## 2024-08-10 LAB — LIPID PANEL
Chol/HDL Ratio: 3.1 ratio (ref 0.0–4.4)
Cholesterol, Total: 122 mg/dL (ref 100–199)
HDL: 39 mg/dL — ABNORMAL LOW (ref >39)
LDL Chol Calc (NIH): 64 mg/dL (ref 0–99)
Triglycerides: 98 mg/dL (ref 0–149)
VLDL Cholesterol Cal: 19 mg/dL (ref 5–40)

## 2024-08-10 LAB — VITAMIN D 25 HYDROXY (VIT D DEFICIENCY, FRACTURES): Vit D, 25-Hydroxy: 36.2 ng/mL (ref 30.0–100.0)

## 2024-08-10 LAB — HEMOGLOBIN A1C
Est. average glucose Bld gHb Est-mCnc: 151 mg/dL
Hgb A1c MFr Bld: 6.9 % — ABNORMAL HIGH (ref 4.8–5.6)

## 2024-08-10 LAB — TSH: TSH: 2.47 u[IU]/mL (ref 0.450–4.500)

## 2024-08-13 ENCOUNTER — Encounter: Payer: Self-pay | Admitting: Internal Medicine

## 2024-08-13 ENCOUNTER — Ambulatory Visit (INDEPENDENT_AMBULATORY_CARE_PROVIDER_SITE_OTHER): Admitting: Internal Medicine

## 2024-08-13 VITALS — BP 105/70 | HR 79 | Ht 61.5 in | Wt 123.2 lb

## 2024-08-13 DIAGNOSIS — E782 Mixed hyperlipidemia: Secondary | ICD-10-CM | POA: Diagnosis not present

## 2024-08-13 DIAGNOSIS — I484 Atypical atrial flutter: Secondary | ICD-10-CM

## 2024-08-13 DIAGNOSIS — Z7984 Long term (current) use of oral hypoglycemic drugs: Secondary | ICD-10-CM | POA: Diagnosis not present

## 2024-08-13 DIAGNOSIS — D696 Thrombocytopenia, unspecified: Secondary | ICD-10-CM | POA: Diagnosis not present

## 2024-08-13 DIAGNOSIS — I5022 Chronic systolic (congestive) heart failure: Secondary | ICD-10-CM

## 2024-08-13 DIAGNOSIS — I10 Essential (primary) hypertension: Secondary | ICD-10-CM | POA: Diagnosis not present

## 2024-08-13 DIAGNOSIS — E1169 Type 2 diabetes mellitus with other specified complication: Secondary | ICD-10-CM

## 2024-08-13 NOTE — Assessment & Plan Note (Signed)
 CBC reviewed, platelet count stable compared to prior

## 2024-08-13 NOTE — Progress Notes (Signed)
 Established Patient Office Visit  Subjective:  Patient ID: Brandi Fischer, female    DOB: 07-11-1966  Age: 58 y.o. MRN: [id number]  CC:  Chief Complaint  Patient presents with   Diabetes   Hyperlipidemia   Hypertension    HPI Brandi Fischer is a 58 y.o. 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  DM: Her HbA1c is 6.9 in 11/25.  She is taking Jardiance 25 mg QD.  Denies polyuria or polyphagia currently.  CHF: She has been seeing Dr Dixon for her congenital heart defect.  She takes Jardiance 25 mg QD, spironolactone  25 mg QD and Lasix  40 mg QOD.  Denies any headache, dizziness, chest pain, dyspnea or palpitations.  She is planned to get AICD placement.  Atrial flutter: Followed by Dr. Melchor.  She is on Toprol , digoxin  and Eliquis currently.  Denies any easy bruising or active bleeding.  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 grade   Unspecified chronic conjunctivitis, left eye     Past Surgical History:  Procedure Laterality Date   ABDOMINAL HYSTERECTOMY     APPENDECTOMY  10/2010   cardiac pacemaker procedure     CARDIAC SURGERY     TUBAL LIGATION      Family History  Problem Relation Age of Onset   Stroke Father    Diabetes Maternal Grandfather    Diabetes Paternal Grandmother    Breast cancer Paternal Grandmother    Breast cancer Paternal Aunt    Colon cancer Neg Hx    Esophageal cancer Neg Hx    Rectal cancer Neg Hx    Stomach cancer Neg Hx     Social History    Socioeconomic History   Marital status: Married    Spouse name: Not on file   Number of children: Not on file   Years of education: Not on file   Highest education level: Not on file  Occupational History   Not on file  Tobacco Use   Smoking status: Never   Smokeless tobacco: Never  Vaping Use   Vaping status: Never Used  Substance and Sexual Activity   Alcohol use: No   Drug use: No   Sexual activity: Not on file  Other Topics Concern   Not on file  Social History Narrative   Not on file   Social Drivers of Health   Financial Resource Strain: Not on file  Food Insecurity: Not on file  Transportation Needs: Not on file  Physical Activity: Not on file  Stress: Not on file  Social Connections: Not on file  Intimate Partner Violence: Not on file    Outpatient Medications Prior to Visit  Medication Sig Dispense Refill   Blood Glucose Monitoring Suppl DEVI 1 each by Does not apply route in the morning, at noon, and at bedtime. May substitute to any manufacturer covered by patient's insurance. 1 each 0   calcium -vitamin D  (OSCAL WITH D) 500-200 MG-UNIT per tablet Take 1 tablet  by mouth.     digoxin  (LANOXIN ) 0.125 MG tablet TAKE 1 TABLET BY MOUTH EVERY DAY 30 tablet 2   ELIQUIS 5 MG TABS tablet TAKE ONE TABLET BY MOUTH EVERY TWELVE HOURS 180 tablet 1   furosemide  (LASIX ) 40 MG tablet Take 1 tablet (40 mg total) by mouth daily. 90 tablet 1   Glucose Blood (BLOOD GLUCOSE TEST STRIPS) STRP 1 each by In Vitro route in the morning, at noon, and at bedtime. May substitute to any manufacturer covered by patient's insurance. 100 strip 0   JARDIANCE 25 MG TABS tablet TAKE ONE TABLET BY MOUTH EVERY DAY 30 tablet 3   Lancets Misc. MISC 1 each by Does not apply route in the morning, at noon, and at bedtime. May substitute to any manufacturer covered by patient's insurance. 100 each 0   latanoprost (XALATAN) 0.005 % ophthalmic solution Place 1 drop into both eyes at bedtime.      levalbuterol  (XOPENEX  HFA) 45 MCG/ACT inhaler Inhale 1 puff into the lungs every 6 (six) hours as needed for wheezing. 1 each 12   Magnesium (V-R MAGNESIUM) 250 MG TABS Take 250 mg by mouth daily.      metoprolol  succinate (TOPROL -XL) 25 MG 24 hr tablet Take 1 tablet (25 mg total) by mouth daily. 90 tablet 1   Multiple Vitamin (MULTIVITAMIN PO) Take 1 tablet by mouth daily.     rosuvastatin  (CRESTOR ) 5 MG tablet TAKE ONE TABLET BY MOUTH EVERY DAY 30 tablet 3   spironolactone  (ALDACTONE ) 25 MG tablet Take 1 tablet (25 mg total) by mouth daily. 30 tablet 5   triamcinolone  (KENALOG ) 0.025 % ointment Apply 1 Application topically 2 (two) times daily. 30 g 0   predniSONE  (DELTASONE ) 20 MG tablet 3 tabs poqday 1-2, 2 tabs poqday 3-4, 1 tab poqday 5-6 12 tablet 0   No facility-administered medications prior to visit.    Allergies  Allergen Reactions   Shellfish Allergy     Hives    ROS Review of Systems  Constitutional:  Negative for chills and fever.  HENT:  Negative for congestion, sinus pressure, sinus pain and sore throat.   Eyes:  Negative for pain and discharge.  Respiratory:  Negative for cough and shortness of breath.   Cardiovascular:  Negative for chest pain, palpitations and leg swelling.  Gastrointestinal:  Negative for abdominal pain, diarrhea, nausea and vomiting.  Endocrine: Negative for polydipsia and polyuria.  Genitourinary:  Negative for dysuria and hematuria.  Musculoskeletal:  Negative for neck pain and neck stiffness.  Skin:  Negative for rash.  Neurological:  Negative for dizziness, syncope, weakness and numbness.  Psychiatric/Behavioral:  Negative for agitation and behavioral problems.       Objective:    Physical Exam Vitals reviewed.  Constitutional:      General: She is not in acute distress.    Appearance: She is not diaphoretic.  HENT:     Head: Normocephalic and atraumatic.     Nose: Nose normal. No congestion.     Mouth/Throat:     Mouth: Mucous  membranes are moist.     Pharynx: No posterior oropharyngeal erythema.  Eyes:     General: No scleral icterus.    Extraocular Movements: Extraocular movements intact.  Cardiovascular:     Rate and Rhythm: Normal rate and regular rhythm.     Pulses: Normal pulses.     Heart sounds: Murmur (Systolic (pronounced in left upper sternal border)) heard.  Pulmonary:     Breath sounds:  No wheezing or rales.  Musculoskeletal:     Cervical back: Neck supple. No rigidity or tenderness.     Right lower leg: No edema.     Left lower leg: No edema.  Skin:    General: Skin is warm.     Findings: No rash.  Neurological:     General: No focal deficit present.     Mental Status: She is alert and oriented to person, place, and time.     Sensory: No sensory deficit.     Motor: No weakness.  Psychiatric:        Mood and Affect: Mood normal.        Behavior: Behavior normal.     BP 105/70   Pulse 79   Ht 5' 1.5 (1.562 m)   Wt 123 lb 3.2 oz (55.9 kg)   SpO2 95%   BMI 22.90 kg/m  Wt Readings from Last 3 Encounters:  08/13/24 123 lb 3.2 oz (55.9 kg)  07/09/24 123 lb (55.8 kg)  06/19/24 124 lb (56.2 kg)    Lab Results  Component Value Date   TSH 2.470 08/09/2024   Lab Results  Component Value Date   WBC 6.9 08/09/2024   HGB 16.4 (H) 08/09/2024   HCT 49.5 (H) 08/09/2024   MCV 95 08/09/2024   PLT 144 (L) 08/09/2024   Lab Results  Component Value Date   NA 137 08/09/2024   K 4.0 08/09/2024   CO2 22 08/09/2024   GLUCOSE 123 (H) 08/09/2024   BUN 18 08/09/2024   CREATININE 1.00 08/09/2024   BILITOT 1.1 08/09/2024   ALKPHOS 70 08/09/2024   AST 26 08/09/2024   ALT 22 08/09/2024   PROT 7.5 08/09/2024   ALBUMIN 5.2 (H) 08/09/2024   CALCIUM  10.0 08/09/2024   ANIONGAP 8 07/02/2020   EGFR 65 08/09/2024   GFR 72.89 05/17/2023   Lab Results  Component Value Date   CHOL 122 08/09/2024   Lab Results  Component Value Date   HDL 39 (L) 08/09/2024   Lab Results  Component Value  Date   LDLCALC 64 08/09/2024   Lab Results  Component Value Date   TRIG 98 08/09/2024   Lab Results  Component Value Date   CHOLHDL 3.1 08/09/2024   Lab Results  Component Value Date   HGBA1C 6.9 (H) 08/09/2024      Assessment & Plan:   Problem List Items Addressed This Visit       Cardiovascular and Mediastinum   Essential hypertension   BP Readings from Last 1 Encounters:  08/13/24 105/70   Well-controlled with Metoprolol  and Spironolactone  Counseled for compliance with the medications Advised DASH diet and moderate exercise/walking, at least 150 mins/week      Relevant Orders   CMP14+EGFR   CBC with Differential/Platelet   Atypical atrial flutter (HCC)   S/p Cardioversion in 02/2020 On Metoprolol , Digoxin  and Eliquis Eliquis patient assistance program form provided Has ICD in place, followed by EP cardiology      Chronic systolic CHF (congestive heart failure) (HCC)   Last echo (03/25) reviewed, showed moderate LV dysfunction -similar compared to prior Followed by cardiology-last visit note reviewed Appears euvolemic currently On Jardiance 25 mg QD, spironolactone  25 mg QD and Lasix  40 mg QD Did not tolerate ACEi/ARB        Endocrine   Type 2 diabetes mellitus with other specified complication - Primary   Lab Results  Component Value Date   HGBA1C 6.9 (H) 08/09/2024   Well-controlled  Associated with HTN, CHF and HLD On Jardiance 25 mg once daily Has glucometer and supplies for intermittent checking of blood glucose Advised to follow diabetic diet On statin and Spironolactone  F/u CMP and lipid panel Diabetic eye exam: Advised to follow up with Ophthalmology for diabetic eye exam      Relevant Orders   CMP14+EGFR   Hemoglobin A1c     Hematopoietic and Hemostatic   Thrombocytopenia   CBC reviewed, platelet count stable compared to prior        Other   Mixed hyperlipidemia   On Crestor  5 mg QD Checked lipid profile Advised to follow DASH  diet       No orders of the defined types were placed in this encounter.    Follow-up: Return in about 6 months (around 02/10/2025) for DM.    Suzzane MARLA Blanch, MD

## 2024-08-13 NOTE — Assessment & Plan Note (Signed)
 Lab Results  Component Value Date   HGBA1C 6.9 (H) 08/09/2024   Well-controlled Associated with HTN, CHF and HLD On Jardiance 25 mg once daily Has glucometer and supplies for intermittent checking of blood glucose Advised to follow diabetic diet On statin and Spironolactone  F/u CMP and lipid panel Diabetic eye exam: Advised to follow up with Ophthalmology for diabetic eye exam

## 2024-08-13 NOTE — Assessment & Plan Note (Signed)
 S/p Cardioversion in 02/2020 On Metoprolol , Digoxin  and Eliquis Eliquis patient assistance program form provided Has ICD in place, followed by EP cardiology

## 2024-08-13 NOTE — Patient Instructions (Signed)
 Please continue to take medications as prescribed.  Please continue to follow low carb diet and perform moderate exercise/walking at least 150 mins/week.  Please get fasting blood tests done before the next visit.

## 2024-08-13 NOTE — Assessment & Plan Note (Signed)
 BP Readings from Last 1 Encounters:  08/13/24 105/70   Well-controlled with Metoprolol  and Spironolactone  Counseled for compliance with the medications Advised DASH diet and moderate exercise/walking, at least 150 mins/week

## 2024-08-13 NOTE — Assessment & Plan Note (Signed)
 On Crestor  5 mg QD Checked lipid profile Advised to follow DASH diet

## 2024-08-13 NOTE — Assessment & Plan Note (Signed)
 Last echo (03/25) reviewed, showed moderate LV dysfunction -similar compared to prior Followed by cardiology-last visit note reviewed Appears euvolemic currently On Jardiance 25 mg QD, spironolactone  25 mg QD and Lasix  40 mg QD Did not tolerate ACEi/ARB

## 2024-09-04 ENCOUNTER — Other Ambulatory Visit: Payer: Self-pay

## 2024-09-04 DIAGNOSIS — I5022 Chronic systolic (congestive) heart failure: Secondary | ICD-10-CM

## 2024-09-04 DIAGNOSIS — I10 Essential (primary) hypertension: Secondary | ICD-10-CM

## 2024-09-04 MED ORDER — METOPROLOL SUCCINATE ER 25 MG PO TB24: 25.0000 mg | ORAL_TABLET | Freq: Every day | ORAL | 1 refills | Status: AC

## 2024-09-21 ENCOUNTER — Ambulatory Visit

## 2024-09-21 VITALS — BP 114/73 | HR 87 | Ht 61.0 in | Wt 126.0 lb

## 2024-09-21 DIAGNOSIS — J069 Acute upper respiratory infection, unspecified: Secondary | ICD-10-CM

## 2024-09-21 MED ORDER — AMOXICILLIN 875 MG PO TABS: 875.0000 mg | ORAL_TABLET | Freq: Two times a day (BID) | ORAL | 0 refills | Status: AC

## 2024-09-21 NOTE — Progress Notes (Unsigned)
" ° °  Established Patient Office Visit  Subjective   Patient ID: Brandi Fischer, female    DOB: Jul 22, 1966  Age: 58 y.o. MRN: [id number]  Chief Complaint  Patient presents with   Medical Management of Chronic Issues    Pt here for cough runny nose and no fever     HPI  Patient Active Problem List   Diagnosis Date Noted   Allergy to alpha-gal 07/09/2024   Erythrocytosis 02/08/2024   Irritant contact dermatitis due to cosmetics 11/16/2022   Wheezing 09/22/2022   GAD (generalized anxiety disorder) 08/03/2022   Osteopenia 03/24/2022   Type 2 diabetes mellitus with other specified complication 07/23/2021   Encounter for general adult medical examination with abnormal findings 07/23/2021   Chronic systolic CHF (congestive heart failure) (HCC) 06/13/2021   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   Paroxysmal atrial fibrillation (HCC) 06/08/2021   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   Mixed hyperlipidemia 01/21/2021   Pulmonary regurgitation 10/22/2020   Neck muscle strain 07/22/2020   H/O Mustard procedure 07/04/2020   Edema of both legs 02/20/2020   Essential hypertension 02/20/2020   Thrombocytopenia 11/28/2019   Atypical atrial flutter (HCC) 05/01/2018   Cardiac pacemaker in situ 01/15/2013   Transposition of great vessels 01/17/2012      ROS    Objective:     BP 114/73   Pulse 87   Ht 5' 1 (1.549 m)   Wt 126 lb (57.2 kg)   SpO2 90%   BMI 23.81 kg/m  BP Readings from Last 3 Encounters:  09/21/24 114/73  08/13/24 105/70  07/09/24 111/62   Wt Readings from Last 3 Encounters:  09/21/24 126 lb (57.2 kg)  08/13/24 123 lb 3.2 oz (55.9 kg)  07/09/24 123 lb (55.8 kg)     Physical Exam   No results found for any visits on 09/21/24.  {Labs (Optional):23779}  The ASCVD Risk score (Arnett DK, et al., 2019) failed to calculate for the following reasons:   The valid total cholesterol range is 130 to 320 mg/dL    Assessment & Plan:   Problem List  Items Addressed This Visit   None   No follow-ups on file.    Leita Longs, FNP  "

## 2024-09-23 ENCOUNTER — Other Ambulatory Visit: Payer: Self-pay | Admitting: Internal Medicine

## 2024-10-08 ENCOUNTER — Other Ambulatory Visit: Payer: Self-pay

## 2024-10-08 ENCOUNTER — Encounter: Payer: Self-pay | Admitting: Internal Medicine

## 2024-10-08 MED ORDER — DIGOXIN 125 MCG PO TABS: 125.0000 ug | ORAL_TABLET | Freq: Every day | ORAL | 2 refills | Status: AC

## 2024-10-11 ENCOUNTER — Encounter: Payer: Self-pay | Admitting: Internal Medicine

## 2024-10-11 LAB — OPHTHALMOLOGY REPORT-SCANNED

## 2024-10-12 ENCOUNTER — Other Ambulatory Visit: Payer: Self-pay | Admitting: Internal Medicine

## 2024-10-12 DIAGNOSIS — E1169 Type 2 diabetes mellitus with other specified complication: Secondary | ICD-10-CM

## 2024-10-23 ENCOUNTER — Other Ambulatory Visit: Payer: Self-pay

## 2024-10-23 DIAGNOSIS — E782 Mixed hyperlipidemia: Secondary | ICD-10-CM

## 2024-10-23 MED ORDER — ROSUVASTATIN CALCIUM 5 MG PO TABS: 5.0000 mg | ORAL_TABLET | Freq: Every day | ORAL | 0 refills | Status: AC

## 2025-02-11 ENCOUNTER — Ambulatory Visit: Admitting: Internal Medicine
